# Patient Record
Sex: Female | Born: 1955 | Race: Black or African American | Hispanic: No | Smoking: Current every day smoker
Health system: Southern US, Community
[De-identification: ages and names within clinical notes are randomized; demographics above are authoritative.]

## PROBLEM LIST (undated history)

## (undated) DIAGNOSIS — G8929 Other chronic pain: Secondary | ICD-10-CM

## (undated) DIAGNOSIS — I10 Essential (primary) hypertension: Secondary | ICD-10-CM

## (undated) DIAGNOSIS — F32A Depression, unspecified: Secondary | ICD-10-CM

## (undated) DIAGNOSIS — E7800 Pure hypercholesterolemia, unspecified: Secondary | ICD-10-CM

## (undated) DIAGNOSIS — F10239 Alcohol dependence with withdrawal, unspecified: Secondary | ICD-10-CM

## (undated) DIAGNOSIS — E213 Hyperparathyroidism, unspecified: Secondary | ICD-10-CM

## (undated) DIAGNOSIS — J449 Chronic obstructive pulmonary disease, unspecified: Secondary | ICD-10-CM

## (undated) DIAGNOSIS — K859 Acute pancreatitis without necrosis or infection, unspecified: Secondary | ICD-10-CM

## (undated) DIAGNOSIS — K219 Gastro-esophageal reflux disease without esophagitis: Secondary | ICD-10-CM

## (undated) DIAGNOSIS — F1020 Alcohol dependence, uncomplicated: Secondary | ICD-10-CM

## (undated) DIAGNOSIS — Z8711 Personal history of peptic ulcer disease: Secondary | ICD-10-CM

## (undated) DIAGNOSIS — G43909 Migraine, unspecified, not intractable, without status migrainosus: Secondary | ICD-10-CM

## (undated) DIAGNOSIS — M1990 Unspecified osteoarthritis, unspecified site: Secondary | ICD-10-CM

## (undated) DIAGNOSIS — F329 Major depressive disorder, single episode, unspecified: Secondary | ICD-10-CM

## (undated) DIAGNOSIS — M545 Low back pain, unspecified: Secondary | ICD-10-CM

## (undated) DIAGNOSIS — J189 Pneumonia, unspecified organism: Secondary | ICD-10-CM

## (undated) DIAGNOSIS — F10939 Alcohol use, unspecified with withdrawal, unspecified: Secondary | ICD-10-CM

## (undated) DIAGNOSIS — K259 Gastric ulcer, unspecified as acute or chronic, without hemorrhage or perforation: Secondary | ICD-10-CM

## (undated) DIAGNOSIS — J45909 Unspecified asthma, uncomplicated: Secondary | ICD-10-CM

## (undated) DIAGNOSIS — F419 Anxiety disorder, unspecified: Secondary | ICD-10-CM

## (undated) DIAGNOSIS — K5790 Diverticulosis of intestine, part unspecified, without perforation or abscess without bleeding: Secondary | ICD-10-CM

## (undated) DIAGNOSIS — D519 Vitamin B12 deficiency anemia, unspecified: Secondary | ICD-10-CM

## (undated) DIAGNOSIS — R011 Cardiac murmur, unspecified: Secondary | ICD-10-CM

## (undated) DIAGNOSIS — K635 Polyp of colon: Secondary | ICD-10-CM

## (undated) DIAGNOSIS — Z8719 Personal history of other diseases of the digestive system: Secondary | ICD-10-CM

## (undated) HISTORY — PX: THUMB FUSION: SUR636

## (undated) HISTORY — PX: FRACTURE SURGERY: SHX138

## (undated) HISTORY — DX: Alcohol dependence, uncomplicated: F10.20

## (undated) HISTORY — PX: TUBAL LIGATION: SHX77

## (undated) HISTORY — DX: Hyperparathyroidism, unspecified: E21.3

## (undated) HISTORY — DX: Diverticulosis of intestine, part unspecified, without perforation or abscess without bleeding: K57.90

## (undated) HISTORY — DX: Acute pancreatitis without necrosis or infection, unspecified: K85.90

## (undated) HISTORY — DX: Unspecified asthma, uncomplicated: J45.909

## (undated) HISTORY — DX: Polyp of colon: K63.5

## (undated) HISTORY — PX: ABDOMINAL HYSTERECTOMY: SHX81

## (undated) HISTORY — PX: TIBIA FRACTURE SURGERY: SHX806

## (undated) HISTORY — DX: Gastric ulcer, unspecified as acute or chronic, without hemorrhage or perforation: K25.9

## (undated) HISTORY — PX: EXCISIONAL HEMORRHOIDECTOMY: SHX1541

---

## 2013-03-31 LAB — HM DEXA SCAN

## 2014-06-14 LAB — HM MAMMOGRAPHY: HM Mammogram: NORMAL

## 2015-03-04 LAB — HM COLONOSCOPY

## 2015-05-14 ENCOUNTER — Encounter (HOSPITAL_BASED_OUTPATIENT_CLINIC_OR_DEPARTMENT_OTHER): Payer: Self-pay

## 2015-05-14 ENCOUNTER — Emergency Department (HOSPITAL_BASED_OUTPATIENT_CLINIC_OR_DEPARTMENT_OTHER): Payer: Medicaid - Out of State

## 2015-05-14 ENCOUNTER — Inpatient Hospital Stay (HOSPITAL_BASED_OUTPATIENT_CLINIC_OR_DEPARTMENT_OTHER)
Admission: EM | Admit: 2015-05-14 | Discharge: 2015-05-18 | DRG: 392 | Disposition: A | Discharge status: Home / Self Care | Payer: Medicaid - Out of State | Attending: Internal Medicine | Admitting: Internal Medicine

## 2015-05-14 DIAGNOSIS — R001 Bradycardia, unspecified: Secondary | ICD-10-CM | POA: Diagnosis present

## 2015-05-14 DIAGNOSIS — F17210 Nicotine dependence, cigarettes, uncomplicated: Secondary | ICD-10-CM | POA: Diagnosis present

## 2015-05-14 DIAGNOSIS — F419 Anxiety disorder, unspecified: Secondary | ICD-10-CM | POA: Diagnosis present

## 2015-05-14 DIAGNOSIS — Z880 Allergy status to penicillin: Secondary | ICD-10-CM | POA: Diagnosis not present

## 2015-05-14 DIAGNOSIS — I10 Essential (primary) hypertension: Secondary | ICD-10-CM | POA: Diagnosis present

## 2015-05-14 DIAGNOSIS — E785 Hyperlipidemia, unspecified: Secondary | ICD-10-CM | POA: Diagnosis present

## 2015-05-14 DIAGNOSIS — M1990 Unspecified osteoarthritis, unspecified site: Secondary | ICD-10-CM | POA: Diagnosis present

## 2015-05-14 DIAGNOSIS — K5732 Diverticulitis of large intestine without perforation or abscess without bleeding: Principal | ICD-10-CM | POA: Diagnosis present

## 2015-05-14 DIAGNOSIS — F329 Major depressive disorder, single episode, unspecified: Secondary | ICD-10-CM | POA: Diagnosis present

## 2015-05-14 DIAGNOSIS — K219 Gastro-esophageal reflux disease without esophagitis: Secondary | ICD-10-CM | POA: Diagnosis present

## 2015-05-14 DIAGNOSIS — K5792 Diverticulitis of intestine, part unspecified, without perforation or abscess without bleeding: Secondary | ICD-10-CM | POA: Diagnosis not present

## 2015-05-14 DIAGNOSIS — G8929 Other chronic pain: Secondary | ICD-10-CM | POA: Diagnosis present

## 2015-05-14 DIAGNOSIS — Z8711 Personal history of peptic ulcer disease: Secondary | ICD-10-CM | POA: Diagnosis not present

## 2015-05-14 DIAGNOSIS — Z8701 Personal history of pneumonia (recurrent): Secondary | ICD-10-CM

## 2015-05-14 DIAGNOSIS — Z91040 Latex allergy status: Secondary | ICD-10-CM

## 2015-05-14 DIAGNOSIS — J449 Chronic obstructive pulmonary disease, unspecified: Secondary | ICD-10-CM | POA: Diagnosis present

## 2015-05-14 DIAGNOSIS — R1032 Left lower quadrant pain: Secondary | ICD-10-CM | POA: Diagnosis not present

## 2015-05-14 DIAGNOSIS — I252 Old myocardial infarction: Secondary | ICD-10-CM | POA: Diagnosis not present

## 2015-05-14 DIAGNOSIS — Z90710 Acquired absence of both cervix and uterus: Secondary | ICD-10-CM | POA: Diagnosis not present

## 2015-05-14 DIAGNOSIS — M545 Low back pain: Secondary | ICD-10-CM | POA: Diagnosis present

## 2015-05-14 DIAGNOSIS — G43909 Migraine, unspecified, not intractable, without status migrainosus: Secondary | ICD-10-CM | POA: Diagnosis present

## 2015-05-14 DIAGNOSIS — E876 Hypokalemia: Secondary | ICD-10-CM | POA: Diagnosis present

## 2015-05-14 HISTORY — DX: Pure hypercholesterolemia, unspecified: E78.00

## 2015-05-14 HISTORY — DX: Vitamin B12 deficiency anemia, unspecified: D51.9

## 2015-05-14 HISTORY — DX: Low back pain: M54.5

## 2015-05-14 HISTORY — DX: Other chronic pain: G89.29

## 2015-05-14 HISTORY — DX: Low back pain, unspecified: M54.50

## 2015-05-14 HISTORY — DX: Migraine, unspecified, not intractable, without status migrainosus: G43.909

## 2015-05-14 HISTORY — DX: Major depressive disorder, single episode, unspecified: F32.9

## 2015-05-14 HISTORY — DX: Depression, unspecified: F32.A

## 2015-05-14 HISTORY — DX: Personal history of peptic ulcer disease: Z87.11

## 2015-05-14 HISTORY — DX: Gastro-esophageal reflux disease without esophagitis: K21.9

## 2015-05-14 HISTORY — DX: Unspecified osteoarthritis, unspecified site: M19.90

## 2015-05-14 HISTORY — DX: Anxiety disorder, unspecified: F41.9

## 2015-05-14 HISTORY — DX: Pneumonia, unspecified organism: J18.9

## 2015-05-14 HISTORY — DX: Essential (primary) hypertension: I10

## 2015-05-14 HISTORY — DX: Alcohol use, unspecified with withdrawal, unspecified: F10.939

## 2015-05-14 HISTORY — DX: Cardiac murmur, unspecified: R01.1

## 2015-05-14 HISTORY — DX: Chronic obstructive pulmonary disease, unspecified: J44.9

## 2015-05-14 HISTORY — DX: Personal history of other diseases of the digestive system: Z87.19

## 2015-05-14 HISTORY — DX: Alcohol dependence with withdrawal, unspecified: F10.239

## 2015-05-14 LAB — URINALYSIS, ROUTINE W REFLEX MICROSCOPIC
BILIRUBIN URINE: NEGATIVE
GLUCOSE, UA: NEGATIVE mg/dL
HGB URINE DIPSTICK: NEGATIVE
Ketones, ur: NEGATIVE mg/dL
LEUKOCYTES UA: NEGATIVE
Nitrite: POSITIVE — AB
Protein, ur: NEGATIVE mg/dL
Specific Gravity, Urine: 1.013 (ref 1.005–1.030)
Urobilinogen, UA: 1 mg/dL (ref 0.0–1.0)
pH: 7.5 (ref 5.0–8.0)

## 2015-05-14 LAB — URINE MICROSCOPIC-ADD ON

## 2015-05-14 LAB — COMPREHENSIVE METABOLIC PANEL
ALBUMIN: 3.5 g/dL (ref 3.5–5.0)
ALT: 11 U/L — AB (ref 14–54)
AST: 13 U/L — AB (ref 15–41)
Alkaline Phosphatase: 68 U/L (ref 38–126)
Anion gap: 4 — ABNORMAL LOW (ref 5–15)
BUN: 7 mg/dL (ref 6–20)
CHLORIDE: 114 mmol/L — AB (ref 101–111)
CO2: 23 mmol/L (ref 22–32)
Calcium: 9.1 mg/dL (ref 8.9–10.3)
Creatinine, Ser: 0.75 mg/dL (ref 0.44–1.00)
GFR calc Af Amer: >60 mL/min (ref >60)
Glucose, Bld: 96 mg/dL (ref 65–99)
POTASSIUM: 3.2 mmol/L — AB (ref 3.5–5.1)
Sodium: 141 mmol/L (ref 135–145)
TOTAL PROTEIN: 6.7 g/dL (ref 6.5–8.1)
Total Bilirubin: 0.5 mg/dL (ref 0.3–1.2)

## 2015-05-14 LAB — CBC WITH DIFFERENTIAL/PLATELET
BASOS ABS: 0 10*3/uL (ref 0.0–0.1)
Basophils Relative: 0 % (ref 0–1)
EOS ABS: 0.1 10*3/uL (ref 0.0–0.7)
Eosinophils Relative: 1 % (ref 0–5)
HCT: 40.3 % (ref 36.0–46.0)
Hemoglobin: 13.2 g/dL (ref 12.0–15.0)
Lymphocytes Relative: 18 % (ref 12–46)
Lymphs Abs: 1.5 10*3/uL (ref 0.7–4.0)
MCH: 30 pg (ref 26.0–34.0)
MCHC: 32.8 g/dL (ref 30.0–36.0)
MCV: 91.6 fL (ref 78.0–100.0)
MONOS PCT: 7 % (ref 3–12)
Monocytes Absolute: 0.6 10*3/uL (ref 0.1–1.0)
Neutro Abs: 6.3 10*3/uL (ref 1.7–7.7)
Neutrophils Relative %: 74 % (ref 43–77)
Platelets: 286 10*3/uL (ref 150–400)
RBC: 4.4 MIL/uL (ref 3.87–5.11)
RDW: 13.1 % (ref 11.5–15.5)
WBC: 8.6 10*3/uL (ref 4.0–10.5)

## 2015-05-14 MED ORDER — ENOXAPARIN SODIUM 40 MG/0.4ML ~~LOC~~ SOLN: 40.0000 mg | SUBCUTANEOUS | Status: DC

## 2015-05-14 MED ORDER — MORPHINE SULFATE 4 MG/ML IJ SOLN
4.0000 mg | INTRAMUSCULAR | Status: AC | PRN
  Administered 2015-05-14 – 2015-05-15 (×3): 4 mg via INTRAVENOUS
  Filled 2015-05-14 (×3): qty 1

## 2015-05-14 MED ORDER — HYDROMORPHONE HCL 1 MG/ML IJ SOLN
1.0000 mg | Freq: Once | INTRAMUSCULAR | Status: AC
  Administered 2015-05-14: 1 mg via INTRAVENOUS
  Filled 2015-05-14: qty 1

## 2015-05-14 MED ORDER — METRONIDAZOLE IN NACL 5-0.79 MG/ML-% IV SOLN
500.0000 mg | Freq: Once | INTRAVENOUS | Status: AC
  Administered 2015-05-14: 500 mg via INTRAVENOUS
  Filled 2015-05-14: qty 100

## 2015-05-14 MED ORDER — CIPROFLOXACIN IN D5W 400 MG/200ML IV SOLN
400.0000 mg | Freq: Two times a day (BID) | INTRAVENOUS | Status: DC
  Administered 2015-05-15 – 2015-05-17 (×5): 400 mg via INTRAVENOUS
  Filled 2015-05-14 (×6): qty 200

## 2015-05-14 MED ORDER — ONDANSETRON HCL 4 MG/2ML IJ SOLN
4.0000 mg | Freq: Four times a day (QID) | INTRAMUSCULAR | Status: DC | PRN
  Administered 2015-05-15 (×2): 4 mg via INTRAVENOUS
  Filled 2015-05-14 (×2): qty 2

## 2015-05-14 MED ORDER — MORPHINE SULFATE 2 MG/ML IJ SOLN
2.0000 mg | INTRAMUSCULAR | Status: DC | PRN
  Administered 2015-05-15: 2 mg via INTRAVENOUS
  Filled 2015-05-14: qty 1

## 2015-05-14 MED ORDER — ONDANSETRON HCL 4 MG/2ML IJ SOLN
4.0000 mg | Freq: Once | INTRAMUSCULAR | Status: AC
  Administered 2015-05-14: 4 mg via INTRAVENOUS
  Filled 2015-05-14: qty 2

## 2015-05-14 MED ORDER — ONDANSETRON HCL 4 MG PO TABS: 4.0000 mg | ORAL_TABLET | Freq: Four times a day (QID) | ORAL | Status: DC | PRN

## 2015-05-14 MED ORDER — POTASSIUM CHLORIDE IN NACL 20-0.9 MEQ/L-% IV SOLN
INTRAVENOUS | Status: DC
  Administered 2015-05-15 – 2015-05-17 (×3): via INTRAVENOUS
  Filled 2015-05-14 (×9): qty 1000

## 2015-05-14 MED ORDER — IOHEXOL 300 MG/ML  SOLN
25.0000 mL | Freq: Once | INTRAMUSCULAR | Status: AC | PRN
  Administered 2015-05-14: 25 mL via ORAL

## 2015-05-14 MED ORDER — IOHEXOL 300 MG/ML  SOLN
100.0000 mL | Freq: Once | INTRAMUSCULAR | Status: AC | PRN
  Administered 2015-05-14: 100 mL via INTRAVENOUS

## 2015-05-14 MED ORDER — CIPROFLOXACIN IN D5W 400 MG/200ML IV SOLN
400.0000 mg | Freq: Once | INTRAVENOUS | Status: AC
  Administered 2015-05-14: 400 mg via INTRAVENOUS
  Filled 2015-05-14: qty 200

## 2015-05-14 MED ORDER — ASPIRIN EC 81 MG PO TBEC
81.0000 mg | DELAYED_RELEASE_TABLET | Freq: Every day | ORAL | Status: DC
  Administered 2015-05-15 – 2015-05-18 (×4): 81 mg via ORAL
  Filled 2015-05-14 (×5): qty 1

## 2015-05-14 MED ORDER — ENOXAPARIN SODIUM 40 MG/0.4ML ~~LOC~~ SOLN
40.0000 mg | Freq: Every day | SUBCUTANEOUS | Status: DC
  Administered 2015-05-15 – 2015-05-17 (×4): 40 mg via SUBCUTANEOUS
  Filled 2015-05-14 (×4): qty 0.4

## 2015-05-14 MED ORDER — METRONIDAZOLE IN NACL 5-0.79 MG/ML-% IV SOLN
500.0000 mg | Freq: Three times a day (TID) | INTRAVENOUS | Status: DC
  Administered 2015-05-15 – 2015-05-17 (×8): 500 mg via INTRAVENOUS
  Filled 2015-05-14 (×9): qty 100

## 2015-05-14 NOTE — H&P (Signed)
[facility] History and Physical  Lisa Pham ZOX:[id number] DOB: 02/23/1956 DOA: 05/14/2015  Referring physician:  PCP: No PCP Per Patient   Chief Complaint: Abdominal pain.  HPI: Lisa Pham is a 59 y.o. female with a past medical history of alcoholism, hypertension, hyperlipidemia, anxiety, depression who is being admitted to the hospital due to left lower quadrant pain, multiple episodes of diarrhea, nausea with so vomiting since earlier in the day. She states this is the first time that she has pain like that. She denies sick contacts or travel history. Workup is consistent with acute diverticulitis of the sigmoid colon.  When seen the patient was in no acute distress and stated that her pain was better.   Review of Systems:  Constitutional:  No weight loss, night sweats, Fevers, chills, fatigue.  HEENT:  No headaches, Difficulty swallowing,Tooth/dental problems,Sore throat,  No sneezing, itching, ear ache, nasal congestion, post nasal drip,  Cardio-vascular:  No chest pain, Orthopnea, PND, swelling in lower extremities, anasarca, dizziness, palpitations  GI:  Positive indigestion, abdominal pain, nausea,  diarrhea,  loss of appetite.  Denies vomiting. Resp:  No shortness of breath with exertion or at rest. No excess mucus, no productive cough, No non-productive cough, No coughing up of blood.No change in color of mucus.No wheezing.No chest wall deformity  Skin:  no rash or lesions.  GU:  no dysuria, change in color of urine, no urgency or frequency. No flank pain.  Musculoskeletal:  No joint pain or swelling. No decreased range of motion. No back pain.  Psych:  No change in mood or affect. No depression or anxiety. No memory loss.   Past Medical History  Diagnosis Date  . Hypertension   . COPD (chronic obstructive pulmonary disease)   . High cholesterol   . Heart murmur   . Myocardial infarction ~ 2011    "mild"  . Pneumonia ~ 2013  . B12 deficiency  anemia   . GERD (gastroesophageal reflux disease)   . History of stomach ulcers   . Migraine     "3-4 times/wk or more" (05/14/2015)  . Chronic lower back pain   . Arthritis     "all over" (05/14/2015)  . Anxiety   . Depression   . Alcohol withdrawal    Past Surgical History  Procedure Laterality Date  . Tibia fracture surgery Left ~ 2000  . Abdominal hysterectomy    . Excisional hemorrhoidectomy  ~ 1970  . Thumb fusion Bilateral ~ 2010    "took something from wrist/forearm & put it in my thumb"  . Tubal ligation    . Fracture surgery     Social History:  reports that she has been smoking Cigarettes.  She has a 5.4 pack-year smoking history. She has never used smokeless tobacco. She reports that she drinks about 7.2 oz of alcohol per week. She reports that she uses illicit drugs (Cocaine, "Crack" cocaine, and Marijuana).  Allergies  Allergen Reactions  . Latex   . Penicillins Other (See Comments)    Yeast infection    History reviewed. No pertinent family history.   Prior to Admission medications   Medication Sig Start Date End Date Taking? Authorizing Provider  amLODipine (NORVASC) 10 MG tablet Take 10 mg by mouth daily.   Yes Historical Provider, MD  atorvastatin (LIPITOR) 40 MG tablet Take 40 mg by mouth daily.   Yes Historical Provider, MD  gabapentin (NEURONTIN) 600 MG tablet Take 600 mg by mouth 3 (three) times daily.   Yes  Historical Provider, MD  losartan (COZAAR) 100 MG tablet Take 100 mg by mouth daily.   Yes Historical Provider, MD  montelukast (SINGULAIR) 10 MG tablet Take 10 mg by mouth at bedtime.   Yes Historical Provider, MD  topiramate (TOPAMAX) 50 MG tablet Take 50 mg by mouth 2 (two) times daily.   Yes Historical Provider, MD  venlafaxine (EFFEXOR) 25 MG tablet Take 25 mg by mouth 2 (two) times daily.   Yes Historical Provider, MD  vitamin B-12 (CYANOCOBALAMIN) 1000 MCG tablet Take 1,000 mcg by mouth daily.   Yes Historical Provider, MD  Vitamin D,  Ergocalciferol, (DRISDOL) 50000 UNITS CAPS capsule Take 50,000 Units by mouth every 7 (seven) days.   Yes Historical Provider, MD   Physical Exam: Filed Vitals:   05/14/15 1740 05/14/15 1947 05/14/15 2111 05/14/15 2257  BP: 108/62 134/72 101/60 114/50  Pulse: 55 56 51 51  Temp:  98.1 F (36.7 C)  98.7 F (37.1 C)  TempSrc:  Oral  Oral  Resp: 18 18 16 18   Height:    5\' 4"  (1.626 m)  Weight:    68.6 kg (151 lb 3.8 oz)  SpO2: 95% 98% 93% 98%    Wt Readings from Last 3 Encounters:  05/14/15 68.6 kg (151 lb 3.8 oz)    General:  Appears calm and comfortable Eyes: PERRL, normal lids, irises & conjunctiva ENT: grossly normal hearing, lips & tongue Neck: no LAD, masses or thyromegaly Cardiovascular: RRR, no m/r/g. No LE edema. Telemetry: SR, no arrhythmias  Respiratory: CTA bilaterally, no w/r/r. Normal respiratory effort. Abdomen: Bowel sounds hyperactive, soft, left lower quadrant tenderness on palpation without guarding or rebound tenderness. Skin: no rash or induration seen on limited exam Musculoskeletal: grossly normal tone BUE/BLE Psychiatric: grossly normal mood and affect, speech fluent and appropriate Neurologic: grossly non-focal.          Labs on Admission:  Basic Metabolic Panel:  Recent Labs Lab 05/14/15 1531  NA 141  K 3.2  CL 114  CO2 23  GLUCOSE 96  BUN 7  CREATININE 0.75  CALCIUM 9.1   Liver Function Tests:  Recent Labs Lab 05/14/15 1531  AST 13  ALT 11  ALKPHOS 68  BILITOT 0.5  PROT 6.7  ALBUMIN 3.5   No results for input(s): LIPASE, AMYLASE in the last 168 hours. No results for input(s): AMMONIA in the last 168 hours. CBC:  Recent Labs Lab 05/14/15 1531  WBC 8.6  NEUTROABS 6.3  HGB 13.2  HCT 40.3  MCV 91.6  PLT 286   Cardiac Enzymes: No results for input(s): CKTOTAL, CKMB, CKMBINDEX, TROPONINI in the last 168 hours.  BNP (last 3 results) No results for input(s): BNP in the last 8760 hours.  ProBNP (last 3 results) No  results for input(s): PROBNP in the last 8760 hours.  CBG: No results for input(s): GLUCAP in the last 168 hours.  Radiological Exams on Admission: Ct Abdomen Pelvis W Contrast  05/14/2015   CLINICAL DATA:  Left lower quadrant pain, nausea, diarrhea  EXAM: CT ABDOMEN AND PELVIS WITH CONTRAST  TECHNIQUE: Multidetector CT imaging of the abdomen and pelvis was performed using the standard protocol following bolus administration of intravenous contrast.  CONTRAST:  OMNIPAQUE IOHEXOL 300 MG/ML  SOLN  COMPARISON:  None.  FINDINGS: Lower chest:  Lung bases are clear.  Hepatobiliary: Liver is within normal limits. No suspicious/enhancing hepatic lesions.  Layering gallstone (series 2/ image 33), without associated inflammatory changes.  Pancreas: Within normal limits.  Spleen: Within normal limits.  Adrenals/Urinary Tract: Adrenal glands are within normal limits.  6 mm cyst in the anterior interpolar right kidney (series 7/ image 17). Left kidney is within normal limits. No hydronephrosis.  Bladder is within normal limits.  Stomach/Bowel: Stomach is within normal limits.  No evidence of bowel obstruction.  Normal appendix.  Colonic diverticulosis with inflammatory changes in the left lower quadrant (series 2/ image 62), suggesting proximal sigmoid diverticulitis.  Trace pericolonic fluid/stranding. No drainable fluid collection/ abscess. No free air.  Vascular/Lymphatic: Atherosclerotic calcifications of the abdominal aorta and branch vessels.  No suspicious abdominopelvic lymphadenopathy.  Reproductive: Status post hysterectomy.  No adnexal masses.  Other: Trace pelvic ascites.  Musculoskeletal: Degenerative changes of the visualized thoracolumbar spine, most prominent at L5-S1.  IMPRESSION: Sigmoid diverticulitis.  No evidence of perforation.  Trace pelvic ascites.   Electronically Signed   By: Charline Bills M.D.   On: 05/14/2015 18:10       Assessment/Plan Active Problems:   Diverticulitis    Acute diverticulitis   Hypokalemia.   Hypertension.   COPD.   1. Admitted for IV antibiotic therapy with ciprofloxacin and metronidazole. Continue IV fluids, continue potassium replacement and will give magnesium sulfate since the patient drinks regularly. The patient was advised to be nothing by mouth at least overnight to avoid abdominal discomfort. Continue correcting electrolytes. Continue home pertinent metastases.  Although the patient stated that she is not drinking heavily, has not had alcohol since Saturday, please monitor for signs of DTs.   Code Status: Full code. DVT Prophylaxis: Lovenox. Family Communication:  Ndia, Sampath Daughter   [phone]  Disposition Plan: Home with outpatient follow-up.  Time spent: 70 minutes  Bobette Mo [facility] Pager [phone]

## 2015-05-14 NOTE — ED Notes (Signed)
Patient transported to CT 

## 2015-05-14 NOTE — ED Notes (Signed)
C/o LLQ pain and diarrhea-c/o started Saturday

## 2015-05-14 NOTE — ED Notes (Signed)
Pt stated unable to void-given CCUA

## 2015-05-14 NOTE — ED Provider Notes (Signed)
CSN: [id number]     Arrival date & time 05/14/15  1408 History   First MD Initiated Contact with Patient 05/14/15 1510     Chief Complaint  Patient presents with  . Abdominal Pain      HPI  Patient presents for evaluation of abdominal pain after diarrhea. Today is Tuesday. Symptoms started on Saturday with diarrhea. By Sunday her diarrhea had resolved. However she has had persistent left lower quadrant pain since that time. Now has pain with ambulation and movement. States she is not having liquidy stools. However she is passing "pus filled stool". Denies blood.  She's had a colonoscopy a little over a month ago. Was told she had 2 polyps that were removed and "not cancer" cannot recall if she was told about diverticuli. No travel. No recent antibiotics.  Past Medical History  Diagnosis Date  . Hypertension   . COPD (chronic obstructive pulmonary disease)   . High cholesterol   . Heart murmur   . Myocardial infarction ~ 2011    "mild"  . Pneumonia ~ 2013  . B12 deficiency anemia   . GERD (gastroesophageal reflux disease)   . History of stomach ulcers   . Migraine     "3-4 times/wk or more" (05/14/2015)  . Chronic lower back pain   . Arthritis     "all over" (05/14/2015)  . Anxiety   . Depression   . Alcohol withdrawal    Past Surgical History  Procedure Laterality Date  . Tibia fracture surgery Left ~ 2000  . Abdominal hysterectomy    . Excisional hemorrhoidectomy  ~ 1970  . Thumb fusion Bilateral ~ 2010    "took something from wrist/forearm & put it in my thumb"  . Tubal ligation    . Fracture surgery     History reviewed. No pertinent family history. History  Substance Use Topics  . Smoking status: Current Every Day Smoker -- 0.12 packs/day for 45 years    Types: Cigarettes  . Smokeless tobacco: Never Used     Comment: "allergic to nicotine patch"  . Alcohol Use: 7.2 oz/week    12 Cans of beer per week     Comment: 05/14/2015 "II'm an alcoholic; I may drink 1, 12  pack/wk; sometimes more when I want it; might drink a 6 pack of wine coolers but I don't drink beer w/that; ; get shaky after a couple days if I don't drink"   OB History    No data available     Review of Systems  Constitutional: Negative for fever, chills, diaphoresis, appetite change and fatigue.  HENT: Negative for mouth sores, sore throat and trouble swallowing.   Eyes: Negative for visual disturbance.  Respiratory: Negative for cough, chest tightness, shortness of breath and wheezing.   Cardiovascular: Negative for chest pain.  Gastrointestinal: Positive for nausea, abdominal pain and diarrhea. Negative for vomiting and abdominal distention.  Endocrine: Negative for polydipsia, polyphagia and polyuria.  Genitourinary: Negative for dysuria, frequency and hematuria.  Musculoskeletal: Negative for gait problem.  Skin: Negative for color change, pallor and rash.  Neurological: Negative for dizziness, syncope, light-headedness and headaches.  Hematological: Does not bruise/bleed easily.  Psychiatric/Behavioral: Negative for behavioral problems and confusion.      Allergies  Latex; Penicillins; and Nicotine  Home Medications   Prior to Admission medications   Medication Sig Start Date End Date Taking? Authorizing Provider  amLODipine (NORVASC) 10 MG tablet Take 10 mg by mouth daily.   Yes Historical Provider, MD  atorvastatin (LIPITOR) 40 MG tablet Take 40 mg by mouth daily.   Yes Historical Provider, MD  gabapentin (NEURONTIN) 600 MG tablet Take 600 mg by mouth 3 (three) times daily.   Yes Historical Provider, MD  losartan (COZAAR) 100 MG tablet Take 100 mg by mouth daily.   Yes Historical Provider, MD  montelukast (SINGULAIR) 10 MG tablet Take 10 mg by mouth at bedtime.   Yes Historical Provider, MD  topiramate (TOPAMAX) 50 MG tablet Take 50 mg by mouth 2 (two) times daily.   Yes Historical Provider, MD  venlafaxine (EFFEXOR) 25 MG tablet Take 25 mg by mouth 2 (two) times  daily.   Yes Historical Provider, MD  vitamin B-12 (CYANOCOBALAMIN) 1000 MCG tablet Take 1,000 mcg by mouth daily.   Yes Historical Provider, MD  Vitamin D, Ergocalciferol, (DRISDOL) 50000 UNITS CAPS capsule Take 50,000 Units by mouth every 7 (seven) days.   Yes Historical Provider, MD   BP 114/50 mmHg  Pulse 51  Temp(Src) 98.7 F (37.1 C) (Oral)  Resp 18  Ht 5\' 4"  (1.626 m)  Wt 151 lb 3.8 oz (68.6 kg)  BMI 25.95 kg/m2  SpO2 98% Physical Exam  Constitutional: She is oriented to person, place, and time. She appears well-developed and well-nourished. No distress.  HENT:  Head: Normocephalic.  Eyes: Conjunctivae are normal. Pupils are equal, round, and reactive to light. No scleral icterus.  Neck: Normal range of motion. Neck supple. No thyromegaly present.  Cardiovascular: Normal rate and regular rhythm.  Exam reveals no gallop and no friction rub.   No murmur heard. Pulmonary/Chest: Effort normal and breath sounds normal. No respiratory distress. She has no wheezes. She has no rales.  Abdominal: Soft. Bowel sounds are normal. She exhibits no distension. There is tenderness. There is no rebound.    Musculoskeletal: Normal range of motion.  Neurological: She is alert and oriented to person, place, and time.  Skin: Skin is warm and dry. No rash noted.  Psychiatric: She has a normal mood and affect. Her behavior is normal.    ED Course  Procedures (including critical care time) Labs Review Labs Reviewed  URINALYSIS, ROUTINE W REFLEX MICROSCOPIC (NOT AT [facility]) - Abnormal; Notable for the following:    APPearance CLOUDY (*)    Nitrite POSITIVE (*)    All other components within normal limits  URINE MICROSCOPIC-ADD ON - Abnormal; Notable for the following:    Squamous Epithelial / LPF FEW (*)    Bacteria, UA MANY (*)    All other components within normal limits  COMPREHENSIVE METABOLIC PANEL - Abnormal; Notable for the following:    Potassium 3.2 (*)    Chloride 114 (*)    AST 13  (*)    ALT 11 (*)    Anion gap 4 (*)    All other components within normal limits  CBC WITH DIFFERENTIAL/PLATELET  CBC  CREATININE, SERUM  CBC WITH DIFFERENTIAL/PLATELET  COMPREHENSIVE METABOLIC PANEL    Imaging Review Ct Abdomen Pelvis W Contrast  05/14/2015   CLINICAL DATA:  Left lower quadrant pain, nausea, diarrhea  EXAM: CT ABDOMEN AND PELVIS WITH CONTRAST  TECHNIQUE: Multidetector CT imaging of the abdomen and pelvis was performed using the standard protocol following bolus administration of intravenous contrast.  CONTRAST:  OMNIPAQUE IOHEXOL 300 MG/ML  SOLN  COMPARISON:  None.  FINDINGS: Lower chest:  Lung bases are clear.  Hepatobiliary: Liver is within normal limits. No suspicious/enhancing hepatic lesions.  Layering gallstone (series 2/ image 33), without  associated inflammatory changes.  Pancreas: Within normal limits.  Spleen: Within normal limits.  Adrenals/Urinary Tract: Adrenal glands are within normal limits.  6 mm cyst in the anterior interpolar right kidney (series 7/ image 17). Left kidney is within normal limits. No hydronephrosis.  Bladder is within normal limits.  Stomach/Bowel: Stomach is within normal limits.  No evidence of bowel obstruction.  Normal appendix.  Colonic diverticulosis with inflammatory changes in the left lower quadrant (series 2/ image 62), suggesting proximal sigmoid diverticulitis.  Trace pericolonic fluid/stranding. No drainable fluid collection/ abscess. No free air.  Vascular/Lymphatic: Atherosclerotic calcifications of the abdominal aorta and branch vessels.  No suspicious abdominopelvic lymphadenopathy.  Reproductive: Status post hysterectomy.  No adnexal masses.  Other: Trace pelvic ascites.  Musculoskeletal: Degenerative changes of the visualized thoracolumbar spine, most prominent at L5-S1.  IMPRESSION: Sigmoid diverticulitis.  No evidence of perforation.  Trace pelvic ascites.   Electronically Signed   By: Charline Bills M.D.   On: 05/14/2015  18:10     EKG Interpretation None      MDM   Final diagnoses:  LLQ pain  Diverticulitis of large intestine without perforation or abscess without bleeding    Patient given IV antibiotics per she is still quite painful. CT shows diverticulitis but no frank perforation or abscess. She still is painful walks with a markedly antalgic gait. I discussed the case with Dr. Everardo Pacific. Patient be transferred to [facility] for admission and further care.    Rolland Porter, MD 05/15/15 Marlyne Beards

## 2015-05-14 NOTE — ED Notes (Signed)
Report given to PT[facility] , carelink unavailable

## 2015-05-15 DIAGNOSIS — J449 Chronic obstructive pulmonary disease, unspecified: Secondary | ICD-10-CM

## 2015-05-15 DIAGNOSIS — E876 Hypokalemia: Secondary | ICD-10-CM

## 2015-05-15 LAB — COMPREHENSIVE METABOLIC PANEL
ALK PHOS: 57 U/L (ref 38–126)
ALT: 8 U/L — ABNORMAL LOW (ref 14–54)
AST: 12 U/L — AB (ref 15–41)
Albumin: 3.1 g/dL — ABNORMAL LOW (ref 3.5–5.0)
Anion gap: 6 (ref 5–15)
BUN: 6 mg/dL (ref 6–20)
CO2: 24 mmol/L (ref 22–32)
Calcium: 8.9 mg/dL (ref 8.9–10.3)
Chloride: 112 mmol/L — ABNORMAL HIGH (ref 101–111)
Creatinine, Ser: 0.82 mg/dL (ref 0.44–1.00)
GFR calc Af Amer: >60 mL/min (ref >60)
GFR calc non Af Amer: >60 mL/min (ref >60)
Glucose, Bld: 92 mg/dL (ref 65–99)
POTASSIUM: 3.4 mmol/L — AB (ref 3.5–5.1)
Sodium: 142 mmol/L (ref 135–145)
TOTAL PROTEIN: 6 g/dL — AB (ref 6.5–8.1)
Total Bilirubin: 0.6 mg/dL (ref 0.3–1.2)

## 2015-05-15 LAB — CBC WITH DIFFERENTIAL/PLATELET
BASOS PCT: 0 % (ref 0–1)
Basophils Absolute: 0 10*3/uL (ref 0.0–0.1)
EOS ABS: 0.2 10*3/uL (ref 0.0–0.7)
Eosinophils Relative: 2 % (ref 0–5)
HEMATOCRIT: 36.3 % (ref 36.0–46.0)
Hemoglobin: 11.9 g/dL — ABNORMAL LOW (ref 12.0–15.0)
Lymphocytes Relative: 30 % (ref 12–46)
Lymphs Abs: 2.2 10*3/uL (ref 0.7–4.0)
MCH: 30.4 pg (ref 26.0–34.0)
MCHC: 32.8 g/dL (ref 30.0–36.0)
MCV: 92.8 fL (ref 78.0–100.0)
Monocytes Absolute: 0.5 10*3/uL (ref 0.1–1.0)
Monocytes Relative: 7 % (ref 3–12)
NEUTROS ABS: 4.4 10*3/uL (ref 1.7–7.7)
Neutrophils Relative %: 61 % (ref 43–77)
Platelets: 255 10*3/uL (ref 150–400)
RBC: 3.91 MIL/uL (ref 3.87–5.11)
RDW: 13.5 % (ref 11.5–15.5)
WBC: 7.3 10*3/uL (ref 4.0–10.5)

## 2015-05-15 LAB — CBC
HCT: 38.2 % (ref 36.0–46.0)
HEMOGLOBIN: 12.4 g/dL (ref 12.0–15.0)
MCH: 29.7 pg (ref 26.0–34.0)
MCHC: 32.5 g/dL (ref 30.0–36.0)
MCV: 91.6 fL (ref 78.0–100.0)
PLATELETS: 282 10*3/uL (ref 150–400)
RBC: 4.17 MIL/uL (ref 3.87–5.11)
RDW: 13.4 % (ref 11.5–15.5)
WBC: 7.9 10*3/uL (ref 4.0–10.5)

## 2015-05-15 LAB — CREATININE, SERUM
Creatinine, Ser: 0.84 mg/dL (ref 0.44–1.00)
GFR calc non Af Amer: >60 mL/min (ref >60)

## 2015-05-15 MED ORDER — MORPHINE SULFATE 2 MG/ML IJ SOLN
2.0000 mg | INTRAMUSCULAR | Status: DC | PRN
  Administered 2015-05-15 – 2015-05-18 (×18): 2 mg via INTRAVENOUS
  Filled 2015-05-15 (×18): qty 1

## 2015-05-15 MED ORDER — TIOTROPIUM BROMIDE MONOHYDRATE 18 MCG IN CAPS
18.0000 ug | ORAL_CAPSULE | Freq: Every day | RESPIRATORY_TRACT | Status: DC
  Administered 2015-05-16 – 2015-05-18 (×3): 18 ug via RESPIRATORY_TRACT
  Filled 2015-05-15: qty 5

## 2015-05-15 MED ORDER — ATORVASTATIN CALCIUM 40 MG PO TABS
40.0000 mg | ORAL_TABLET | Freq: Every day | ORAL | Status: DC
  Administered 2015-05-15 – 2015-05-18 (×4): 40 mg via ORAL
  Filled 2015-05-15 (×4): qty 1

## 2015-05-15 MED ORDER — POTASSIUM CHLORIDE CRYS ER 20 MEQ PO TBCR
40.0000 meq | EXTENDED_RELEASE_TABLET | Freq: Once | ORAL | Status: AC
  Administered 2015-05-15: 40 meq via ORAL
  Filled 2015-05-15: qty 2

## 2015-05-15 MED ORDER — GABAPENTIN 600 MG PO TABS
600.0000 mg | ORAL_TABLET | Freq: Three times a day (TID) | ORAL | Status: DC
  Administered 2015-05-15 – 2015-05-18 (×10): 600 mg via ORAL
  Filled 2015-05-15 (×10): qty 1

## 2015-05-15 MED ORDER — TOPIRAMATE 25 MG PO TABS
50.0000 mg | ORAL_TABLET | Freq: Two times a day (BID) | ORAL | Status: DC
Start: 2015-05-15 — End: 2015-05-18
  Administered 2015-05-15 – 2015-05-18 (×7): 50 mg via ORAL
  Filled 2015-05-15 (×7): qty 2

## 2015-05-15 MED ORDER — VENLAFAXINE HCL 25 MG PO TABS
25.0000 mg | ORAL_TABLET | Freq: Two times a day (BID) | ORAL | Status: DC
Start: 2015-05-15 — End: 2015-05-18
  Administered 2015-05-15 – 2015-05-18 (×7): 25 mg via ORAL
  Filled 2015-05-15 (×10): qty 1

## 2015-05-15 NOTE — Progress Notes (Signed)
Initial Nutrition Assessment  DOCUMENTATION CODES:   Not applicable  INTERVENTION:   -RD will follow for diet advancement and supplement diet as appopriate  NUTRITION DIAGNOSIS:   Inadequate oral intake related to altered GI function as evidenced by NPO status.  GOAL:   Patient will meet greater than or equal to 90% of their needs  MONITOR:   Skin, PO intake, Diet advancement, Labs, Weight trends, I & O's  REASON FOR ASSESSMENT:   Malnutrition Screening Tool    ASSESSMENT:   Lisa Pham is a 59 y.o. female with a past medical history of alcoholism, hypertension, hyperlipidemia, anxiety, depression who is being admitted to the hospital due to left lower quadrant pain, multiple episodes of diarrhea, nausea with so vomiting since earlier in the day. She states this is the first time that she has pain like that. She denies sick contacts or travel history. Workup is consistent with acute diverticulitis of the sigmoid colon.  Pt admitted with diverticulitis.   Hx obtained from pt at bedside. She reports ongoing weight loss over the past 4 months, which she attributes to stress. Pt became very tearful at time of visit; she revealed to this RD that she has had a lot going on in the past year including family losses, disability, financial difficulties, and recently relocating to the [place] area to be closer to family. She reports that her appetite has been very poor since Saturday, due to abdominal pain, but has been eating less over the past 4 months due to stress. RD provided emotional support to pt.  Pt reports UBW of 180#. She experienced a 10# intentional weight loss by altering her eating habits (grilled foods and vegetables) in order to prevent diabetes diagnosis. However, she estimates she has lost 19# (11%) within the past 4 months.   She also complains of nausea after eating ice chips.   Nutrition-Focused physical exam completed. Findings are mild depletion fat  depletion (orbital area only), mild muscle depletion (temple area only), and no edema. Pt reports that her family members have mentioned that she has lost weight in her face and that her clothing is looser.   Pt is unsure if she would like to try supplements when diet is advanced, but is willing to consider them if she continues to eat poorly. Discussed importance of good nutritional intake to support healing.  Labs reviewed: K: 3.4.   Diet Order:  Diet NPO time specified Except for: Sips with Meds, Ice Chips  Skin:  Reviewed, no issues  Last BM:  05/11/15  Height:   Ht Readings from Last 1 Encounters:  05/14/15  (1.626 m)    Weight:   Wt Readings from Last 1 Encounters:  05/14/15 151 lb 3.8 oz (68.6 kg)    Ideal Body Weight:  54.4 kg  Wt Readings from Last 10 Encounters:  05/14/15 151 lb 3.8 oz (68.6 kg)    BMI:  Body mass index is 25.95 kg/(m^2).  Estimated Nutritional Needs:   Kcal:  1700-1900  Protein:  75-85 grams  Fluid:  1.7-1.9 L  EDUCATION NEEDS:   Education needs addressed  Stachia Slutsky A. Mayford Knife, RD, LDN, CDE Pager: [phone] After hours Pager: [phone]

## 2015-05-15 NOTE — Progress Notes (Signed)
[facility] PROGRESS NOTE  Lisa Pham ZOX:[id number] DOB: [birth year] DOA: 05/14/2015  PCP: No PCP Per Patient recently moved here from [place]  Brief HPI: 59 year old African-American female with a past medical history of hypertension, hyperlipidemia, depression, presented with left lower quadrant abdominal pain. CT scan showed acute diverticulitis. She was hospitalized for further management.  Past medical history:  Past Medical History  Diagnosis Date  . Hypertension   . COPD (chronic obstructive pulmonary disease)   . High cholesterol   . Heart murmur   . Myocardial infarction ~ 2011    "mild"  . Pneumonia ~ 2013  . B12 deficiency anemia   . GERD (gastroesophageal reflux disease)   . History of stomach ulcers   . Migraine     "3-4 times/wk or more" (05/14/2015)  . Chronic lower back pain   . Arthritis     "all over" (05/14/2015)  . Anxiety   . Depression   . Alcohol withdrawal     Consultants: None  Procedures: None  Antibiotics: Ciprofloxacin and Flagyl  Subjective: Patient feels about the same as yesterday. Pain in the left lower part of the abdomen is about 8 out of 10 in intensity. Denies any nausea, vomiting. Last bowel movement was yesterday.  Objective: Vital Signs  Filed Vitals:   05/14/15 1947 05/14/15 2111 05/14/15 2257 05/15/15 0512  BP: 134/72 101/60 114/50 115/53  Pulse: 56 51 51 48  Temp: 98.1 F (36.7 C)  98.7 F (37.1 C) 98.3 F (36.8 C)  TempSrc: Oral  Oral Oral  Resp: 18 16 18 20   Height:   5\' 4"  (1.626 m)   Weight:   68.6 kg (151 lb 3.8 oz)   SpO2: 98% 93% 98% 99%    Intake/Output Summary (Last 24 hours) at 05/15/15 1130 Last data filed at 05/15/15 0900  Gross per 24 hour  Intake      0 ml  Output    250 ml  Net   -250 ml   Filed Weights   05/14/15 1418 05/14/15 2257  Weight: 70.761 kg (156 lb) 68.6 kg (151 lb 3.8 oz)    General appearance: alert, cooperative, appears stated age and no distress Resp: clear to  auscultation bilaterally Cardio: regular rate and rhythm, S1, S2 normal, no murmur, click, rub or gallop GI: Abdomen is soft. Tender in the left lower quadrant without any rebound, rigidity or guarding. No masses or organomegaly. Bowel sounds are present. Extremities: extremities normal, atraumatic, no cyanosis or edema Neurologic: No focal deficits.  Lab Results:  Basic Metabolic Panel:  Recent Labs Lab 05/14/15 1531 05/15/15 0125 05/15/15 0542  NA 141  --  142  K 3.2*  --  3.4*  CL 114*  --  112*  CO2 23  --  24  GLUCOSE 96  --  92  BUN 7  --  6  CREATININE 0.75 0.84 0.82  CALCIUM 9.1  --  8.9   Liver Function Tests:  Recent Labs Lab 05/14/15 1531 05/15/15 0542  AST 13* 12*  ALT 11* 8*  ALKPHOS 68 57  BILITOT 0.5 0.6  PROT 6.7 6.0*  ALBUMIN 3.5 3.1*   CBC:  Recent Labs Lab 05/14/15 1531 05/15/15 0125 05/15/15 0542  WBC 8.6 7.9 7.3  NEUTROABS 6.3  --  4.4  HGB 13.2 12.4 11.9*  HCT 40.3 38.2 36.3  MCV 91.6 91.6 92.8  PLT 286 282 255     Studies/Results: Ct Abdomen Pelvis W Contrast  05/14/2015   CLINICAL  DATA:  Left lower quadrant pain, nausea, diarrhea  EXAM: CT ABDOMEN AND PELVIS WITH CONTRAST  TECHNIQUE: Multidetector CT imaging of the abdomen and pelvis was performed using the standard protocol following bolus administration of intravenous contrast.  CONTRAST:  OMNIPAQUE IOHEXOL 300 MG/ML  SOLN  COMPARISON:  None.  FINDINGS: Lower chest:  Lung bases are clear.  Hepatobiliary: Liver is within normal limits. No suspicious/enhancing hepatic lesions.  Layering gallstone (series 2/ image 33), without associated inflammatory changes.  Pancreas: Within normal limits.  Spleen: Within normal limits.  Adrenals/Urinary Tract: Adrenal glands are within normal limits.  6 mm cyst in the anterior interpolar right kidney (series 7/ image 17). Left kidney is within normal limits. No hydronephrosis.  Bladder is within normal limits.  Stomach/Bowel: Stomach is within  normal limits.  No evidence of bowel obstruction.  Normal appendix.  Colonic diverticulosis with inflammatory changes in the left lower quadrant (series 2/ image 62), suggesting proximal sigmoid diverticulitis.  Trace pericolonic fluid/stranding. No drainable fluid collection/ abscess. No free air.  Vascular/Lymphatic: Atherosclerotic calcifications of the abdominal aorta and branch vessels.  No suspicious abdominopelvic lymphadenopathy.  Reproductive: Status post hysterectomy.  No adnexal masses.  Other: Trace pelvic ascites.  Musculoskeletal: Degenerative changes of the visualized thoracolumbar spine, most prominent at L5-S1.  IMPRESSION: Sigmoid diverticulitis.  No evidence of perforation.  Trace pelvic ascites.   Electronically Signed   By: Charline Bills M.D.   On: 05/14/2015 18:10    Medications:  Scheduled: . aspirin EC  81 mg Oral Daily  . atorvastatin  40 mg Oral Daily  . ciprofloxacin  400 mg Intravenous Q12H  . enoxaparin (LOVENOX) injection  40 mg Subcutaneous QHS  . gabapentin  600 mg Oral TID  . metronidazole  500 mg Intravenous Q8H  . tiotropium  18 mcg Inhalation Daily  . topiramate  50 mg Oral BID  . venlafaxine  25 mg Oral BID   Continuous: . 0.9 % NaCl with KCl 20 mEq / L 100 mL/hr at 05/15/15 0033   ZOX:WRUEAVWU injection, ondansetron **OR** ondansetron (ZOFRAN) IV  Assessment/Plan:  Active Problems:   Diverticulitis   Acute diverticulitis    Acute sigmoid diverticulitis Continue IV antibiotics with Cipro and Flagyl. Continue IV fluids. Ice chips only for now. Pain medications as needed. Correct electrolytes.  History of migraine headaches Resume her home medications including Topamax.  History of COPD Continue Spiriva  Hypokalemia Replete potassium  DVT Prophylaxis: Lovenox    Code Status: Full code  Family Communication: Discussed with the patient  Disposition Plan: Not ready for discharge. Await improvement.  Follow-up Appointment?: May need to  be set up with a PCP as she is new to this area.   LOS: 1 day   [facility]  [facility] Pager [phone] 05/15/2015, 11:30 AM  If 7PM-7AM, please contact night-coverage at [email or web address], password [facility]

## 2015-05-15 NOTE — Care Management (Signed)
Received communication from [employer], RN that patient just recently moved to [facility] from [place]. Patient had [facility] and does not have a PCP. Met with patient at bedside to discuss [place]. Patient interested in establishing care with clinic after discharge but wants to discuss with her daughter before appointment made.  Patient given clinic contact information.  Carles Collet, RN CM updated.

## 2015-05-15 NOTE — Progress Notes (Signed)
Spoke with Marcelino Duster, Artist, about patient's out of state [employer]. In order for patient to have [place] Medicaid she would need to cancel her [place] policy and take her disability papers to [facility] DSS office and apply for [place] Medicaid. Patient has no PCP locally, moved here two weeks ago. Will pursue [facility] for follow up care and medication help at time of discharge.

## 2015-05-15 NOTE — Care Management Note (Signed)
Case Management Note  Patient Details  Name: Kensleigh Gates MRN: [id number] Date of Birth: 10-28-1955  Subjective/Objective:                 Patient from out of state, [place]. Has out of state medicaid (see previous note). Referred to Peterson Lombard [facility] for follow up. Patient is staying with family. Here for diverticulitis.   Action/Plan:  Will continue to follow.  Expected Discharge Date:                  Expected Discharge Plan:  Home/Self Care  In-House Referral:     Discharge planning Services  CM Consult, Indigent Health Clinic  Post Acute Care Choice:    Choice offered to:     DME Arranged:    DME Agency:     HH Arranged:    HH Agency:     Status of Service:  In process, will continue to follow  Medicare Important Message Given:    Date Medicare IM Given:    Medicare IM give by:    Date Additional Medicare IM Given:    Additional Medicare Important Message give by:     If discussed at Long Length of Stay Meetings, dates discussed:    Additional Comments:  Lawerance Sabal, RN 05/15/2015, 1:55 PM

## 2015-05-15 NOTE — Progress Notes (Signed)
Consulted Peterson Lombard RN CM [facility] to help coordinate care and follow up after discharge.

## 2015-05-16 LAB — CBC
HEMATOCRIT: 36.8 % (ref 36.0–46.0)
HEMOGLOBIN: 11.9 g/dL — AB (ref 12.0–15.0)
MCH: 30 pg (ref 26.0–34.0)
MCHC: 32.3 g/dL (ref 30.0–36.0)
MCV: 92.7 fL (ref 78.0–100.0)
Platelets: 242 10*3/uL (ref 150–400)
RBC: 3.97 MIL/uL (ref 3.87–5.11)
RDW: 13.2 % (ref 11.5–15.5)
WBC: 5.4 10*3/uL (ref 4.0–10.5)

## 2015-05-16 LAB — MAGNESIUM: Magnesium: 1.8 mg/dL (ref 1.7–2.4)

## 2015-05-16 LAB — BASIC METABOLIC PANEL
Anion gap: 5 (ref 5–15)
BUN: 6 mg/dL (ref 6–20)
CO2: 20 mmol/L — ABNORMAL LOW (ref 22–32)
Calcium: 9 mg/dL (ref 8.9–10.3)
Chloride: 116 mmol/L — ABNORMAL HIGH (ref 101–111)
Creatinine, Ser: 0.8 mg/dL (ref 0.44–1.00)
GFR calc Af Amer: >60 mL/min (ref >60)
Glucose, Bld: 87 mg/dL (ref 65–99)
POTASSIUM: 4 mmol/L (ref 3.5–5.1)
Sodium: 141 mmol/L (ref 135–145)

## 2015-05-16 NOTE — Care Management (Signed)
This Case Manager received call from Lawerance Sabal that patient wanting hospital follow-up appointment at [facility] and [facility]. Wednesday preferred day.  Hospital follow-up appointment obtained on 05/22/15 at 0915 with Dr. Venetia Night.  Appointment placed on AVS. Lawerance Sabal, RN CM updated.

## 2015-05-16 NOTE — Progress Notes (Signed)
[facility] PROGRESS NOTE  Lisa Pham ZOX:[id number] DOB: 24-Jun-1956 DOA: 05/14/2015  PCP: No PCP Per Patient recently moved here from [place]  Brief HPI: 59 year old African-American female with a past medical history of hypertension, hyperlipidemia, depression, presented with left lower quadrant abdominal pain. CT scan showed acute diverticulitis. She was hospitalized for further management.  Past medical history:  Past Medical History  Diagnosis Date  . Hypertension   . COPD (chronic obstructive pulmonary disease)   . High cholesterol   . Heart murmur   . Myocardial infarction ~ 2011    "mild"  . Pneumonia ~ 2013  . B12 deficiency anemia   . GERD (gastroesophageal reflux disease)   . History of stomach ulcers   . Migraine     "3-4 times/wk or more" (05/14/2015)  . Chronic lower back pain   . Arthritis     "all over" (05/14/2015)  . Anxiety   . Depression   . Alcohol withdrawal     Consultants: None  Procedures: None  Antibiotics: Ciprofloxacin and Flagyl  Subjective: Patient starting to feel better. Abdominal Pain is improving. Denies any nausea, vomiting. Had a small bowel movement this morning which was soft.  Objective: Vital Signs  Filed Vitals:   05/15/15 0512 05/15/15 1431 05/15/15 2139 05/16/15 0529  BP: 115/53 113/63 132/54 130/66  Pulse: 48 50 48 55  Temp: 98.3 F (36.8 C) 98.8 F (37.1 C) 99 F (37.2 C) 98.7 F (37.1 C)  TempSrc: Oral Oral Oral Oral  Resp: Height:      Weight:      SpO2: 99% 99% 95% 97%    Intake/Output Summary (Last 24 hours) at 05/16/15 1109 Last data filed at 05/16/15 1048  Gross per 24 hour  Intake   1000 ml  Output    260 ml  Net    740 ml   Filed Weights   05/14/15 1418 05/14/15 2257  Weight: 70.761 kg (156 lb) 68.6 kg (151 lb 3.8 oz)    General appearance: alert, cooperative, appears stated age and no distress Resp: clear to auscultation bilaterally Cardio: regular rate and rhythm,  S1, S2 normal, no murmur, click, rub or gallop GI: Abdomen is soft. Less Tender in the left lower quadrant compared to yesterday, without any rebound, rigidity or guarding. No masses or organomegaly. Bowel sounds are present. Extremities: extremities normal, atraumatic, no cyanosis or edema Neurologic: No focal deficits.  Lab Results:  Basic Metabolic Panel:  Recent Labs Lab 05/14/15 1531 05/15/15 0125 05/15/15 0542 05/16/15 0530  NA 141  --  142 141  K 3.2*  --  3.4* 4.0  CL 114*  --  112* 116*  CO2 23  --  24 20*  GLUCOSE 96  --  92 87  BUN 7  --  6 6  CREATININE 0.75 0.84 0.82 0.80  CALCIUM 9.1  --  8.9 9.0  MG  --   --   --  1.8   Liver Function Tests:  Recent Labs Lab 05/14/15 1531 05/15/15 0542  AST 13* 12*  ALT 11* 8*  ALKPHOS 68 57  BILITOT 0.5 0.6  PROT 6.7 6.0*  ALBUMIN 3.5 3.1*   CBC:  Recent Labs Lab 05/14/15 1531 05/15/15 0125 05/15/15 0542 05/16/15 0530  WBC 8.6 7.9 7.3 5.4  NEUTROABS 6.3  --  4.4  --   HGB 13.2 12.4 11.9* 11.9*  HCT 40.3 38.2 36.3 36.8  MCV 91.6 91.6 92.8 92.7  PLT  PrMcarthur Rossetti KentuckymanMayra Ottie Glaziernnecticut Childbirth & Women'S Center[address] MarylandDr.[phone] nnett County Health CenteClearTomasa Ho75ste [address] stiana KentuckyPellant337 TEXTTAG>[facility] [phone]TransMontaigneGeneral [phone].comAquilla Hacker ora [id number] 101Tomasa HostellerColonial HeightsSt Joseph'S Hospital Health CenterPremier Asc LLC KentuckyChristiana [phone] [phone] WatongaMylo Red Ottie GlazierKentuckyMayra NeerMcarthur RossettiProduct [id number] [phone]TransMontaigneGeneral [phone].comAquilla Hacker Tora [id number] 63Tomasa HostellerMeekerEast Side Surgery CenterChristus St [facility] KentuckyChristiana [phone] [phone] Georgia Health System - Camden CampusMylo Red Ottie GlazierKentuckyMayra NeerMcarthur RossettiProduct [id number] [phone]

## 2015-05-16 NOTE — Care Management Note (Signed)
Case Management Note  Patient Details  Name: Lisa Pham MRN: [id number] Date of Birth: 02-Jan-1956  Subjective/Objective:                 Patient from out of state, [place]. Has out of state medicaid (see previous note). Referred to Peterson Lombard [facility] for follow up. Patient is staying with family. Here for diverticulitis.     Action/Plan:  Spoke with patient about Follow up appointment at [facility]. Patient received pamphlet from [facility]. CM explained to patient that they may use the on site pharmacy to fill prescriptions given to them at discharge. Patient aware that the [facility] pharmacy will not fill narcotics or pain medications prior to the patient being seen by one of their physicians.  Patient aware that they must be seen as a patient prior to the pharmacy filling the prescriptions a second time. Patient stated that daughter requested Wednesday AM appointment time, CM passed this info onto Peterson Lombard RN CM [facility] to make appointment. Patient given inormation CM received from financial counselor on how o get [place] Medicaid. CM explained it and wrote it down so she could share it with her daughter. I provided patient with contact information so daughter could contact me if she has any questions.   Expected Discharge Date:                  Expected Discharge Plan:  Home/Self Care  In-House Referral:     Discharge planning Services  CM Consult, Indigent Health Clinic  Post Acute Care Choice:    Choice offered to:     DME Arranged:    DME Agency:     HH Arranged:    HH Agency:     Status of Service:  In process, will continue to follow  Medicare Important Message Given:    Date Medicare IM Given:    Medicare IM give by:    Date Additional Medicare IM Given:    Additional Medicare Important Message give by:     If discussed at Long Length of Stay Meetings, dates discussed:    Additional Comments:  Lawerance Sabal, RN 05/16/2015,  12:00 PM

## 2015-05-17 DIAGNOSIS — I10 Essential (primary) hypertension: Secondary | ICD-10-CM

## 2015-05-17 MED ORDER — METRONIDAZOLE 500 MG PO TABS
500.0000 mg | ORAL_TABLET | Freq: Three times a day (TID) | ORAL | Status: DC
  Administered 2015-05-17 – 2015-05-18 (×4): 500 mg via ORAL
  Filled 2015-05-17 (×4): qty 1

## 2015-05-17 MED ORDER — CIPROFLOXACIN HCL 500 MG PO TABS
500.0000 mg | ORAL_TABLET | Freq: Two times a day (BID) | ORAL | Status: DC
Start: 2015-05-17 — End: 2015-05-18
  Administered 2015-05-17 – 2015-05-18 (×2): 500 mg via ORAL
  Filled 2015-05-17 (×2): qty 1

## 2015-05-17 MED ORDER — AMLODIPINE BESYLATE 10 MG PO TABS
10.0000 mg | ORAL_TABLET | Freq: Every day | ORAL | Status: DC
  Administered 2015-05-17 – 2015-05-18 (×2): 10 mg via ORAL
  Filled 2015-05-17 (×2): qty 1

## 2015-05-17 NOTE — Progress Notes (Addendum)
[facility] PROGRESS NOTE  Lisa Pham ZOX:[id number] DOB: Jan 06, 1956 DOA: 05/14/2015  PCP: No PCP Per Patient recently moved here from [place]  Brief HPI: 59 year old African-American female with a past medical history of hypertension, hyperlipidemia, depression, presented with left lower quadrant abdominal pain. CT scan showed acute diverticulitis. She was hospitalized for further management.  Past medical history:  Past Medical History  Diagnosis Date  . Hypertension   . COPD (chronic obstructive pulmonary disease)   . High cholesterol   . Heart murmur   . Myocardial infarction ~ 2011    "mild"  . Pneumonia ~ 2013  . B12 deficiency anemia   . GERD (gastroesophageal reflux disease)   . History of stomach ulcers   . Migraine     "3-4 times/wk or more" (05/14/2015)  . Chronic lower back pain   . Arthritis     "all over" (05/14/2015)  . Anxiety   . Depression   . Alcohol withdrawal     Consultants: None  Procedures: None  Antibiotics: Ciprofloxacin and Flagyl  Subjective: Patient tolerated a liquid diet. No worsening in her pain. Did have a couple of loose stools yesterday and did have some cramping associated with it. No nausea, vomiting. Wants her diet to be advanced.  Objective: Vital Signs  Filed Vitals:   05/16/15 0529 05/16/15 1313 05/16/15 2200 05/17/15 0500  BP: 130/66 137/69 150/83 160/76  Pulse: 55 49 50 52  Temp: 98.7 F (37.1 C) 98.6 F (37 C) 98.2 F (36.8 C) 98.2 F (36.8 C)  TempSrc: Oral Oral Oral Oral  Resp: Height:      Weight:      SpO2: 97% 95% 100% 97%    Intake/Output Summary (Last 24 hours) at 05/17/15 1135 Last data filed at 05/17/15 0954  Gross per 24 hour  Intake 2695.83 ml  Output   1300 ml  Net 1395.83 ml   Filed Weights   05/14/15 1418 05/14/15 2257  Weight: 70.761 kg (156 lb) 68.6 kg (151 lb 3.8 oz)    General appearance: alert, cooperative, appears stated age and no distress Resp: clear to  auscultation bilaterally Cardio: regular rate and rhythm, S1, S2 normal, no murmur, click, rub or gallop GI: Abdomen is soft. Nontender today. No masses or organomegaly. Bowel sounds are present. Extremities: extremities normal, atraumatic, no cyanosis or edema Neurologic: No focal deficits.  Lab Results:  Basic Metabolic Panel:  Recent Labs Lab 05/14/15 1531 05/15/15 0125 05/15/15 0542 05/16/15 0530  NA 141  --  142 141  K 3.2*  --  3.4* 4.0  CL 114*  --  112* 116*  CO2 23  --  24 20*  GLUCOSE 96  --  92 87  BUN 7  --  6 6  CREATININE 0.75 0.84 0.82 0.80  CALCIUM 9.1  --  8.9 9.0  MG  --   --   --  1.8   Liver Function Tests:  Recent Labs Lab 05/14/15 1531 05/15/15 0542  AST 13* 12*  ALT 11* 8*  ALKPHOS 68 57  BILITOT 0.5 0.6  PROT 6.7 6.0*  ALBUMIN 3.5 3.1*   CBC:  Recent Labs Lab 05/14/15 1531 05/15/15 0125 05/15/15 0542 05/16/15 0530  WBC 8.6 7.9 7.3 5.4  NEUTROABS 6.3  --  4.4  --   HGB 13.2 12.4 11.9* 11.9*  HCT 40.3 38.2 36.3 36.8  MCV 91.6 91.6 92.8 92.7  PLT 286 282 255 242  Studies/Results: No results found.  Medications:  Scheduled: . aspirin EC  81 mg Oral Daily  . atorvastatin  40 mg Oral Daily  . ciprofloxacin  500 mg Oral BID  . enoxaparin (LOVENOX) injection  40 mg Subcutaneous QHS  . gabapentin  600 mg Oral TID  . metroNIDAZOLE  500 mg Oral 3 times per day  . tiotropium  18 mcg Inhalation Daily  . topiramate  50 mg Oral BID  . venlafaxine  25 mg Oral BID   Continuous: . 0.9 % NaCl with KCl 20 mEq / L 75 mL/hr at 05/16/15 2320   WGN:FAOZHYQM injection, ondansetron **OR** ondansetron (ZOFRAN) IV  Assessment/Plan:  Active Problems:   Diverticulitis   Acute diverticulitis    Acute sigmoid diverticulitis Patient is improving. Advance diet to full liquids. Change her antibiotics to oral. Cutback on IV fluids. Pain medications as needed.   History of migraine headaches Continue Topamax.  History of COPD Continue  Spiriva. Stable  Hypokalemia Repleted  History of essential hypertension Blood pressure is creeping up. Resume her amlodipine. Noted to be bradycardic. Not on any rate limiting drugs. Asymptomatic. Sinus rhythm.  DVT Prophylaxis: Lovenox    Code Status: Full code  Family Communication: Discussed with the patient  Disposition Plan: Patient improving. Diet to be advanced today. Start mobilizing.  Follow-up Appointment?: Being set up with community health and wellness clinic   LOS: 3 days   [facility]  [facility] Pager [phone] 05/17/2015, 11:35 AM  If 7PM-7AM, please contact night-coverage at [email or web address], password [facility]

## 2015-05-17 NOTE — Progress Notes (Signed)
Utilization Review completed. Aristide Waggle RN BSN CM 

## 2015-05-18 LAB — BASIC METABOLIC PANEL
ANION GAP: 3 — AB (ref 5–15)
BUN: <5 mg/dL — ABNORMAL LOW (ref 6–20)
CALCIUM: 9.1 mg/dL (ref 8.9–10.3)
CO2: 23 mmol/L (ref 22–32)
CREATININE: 0.75 mg/dL (ref 0.44–1.00)
Chloride: 116 mmol/L — ABNORMAL HIGH (ref 101–111)
GFR calc Af Amer: >60 mL/min (ref >60)
GFR calc non Af Amer: >60 mL/min (ref >60)
GLUCOSE: 101 mg/dL — AB (ref 65–99)
POTASSIUM: 3.5 mmol/L (ref 3.5–5.1)
Sodium: 142 mmol/L (ref 135–145)

## 2015-05-18 MED ORDER — OXYCODONE HCL 5 MG PO TABS: 5.0000 mg | ORAL_TABLET | ORAL | Status: DC | PRN

## 2015-05-18 MED ORDER — OXYCODONE HCL 5 MG PO TABS
5.0000 mg | ORAL_TABLET | ORAL | Status: DC | PRN
  Administered 2015-05-18: 5 mg via ORAL
  Filled 2015-05-18: qty 1

## 2015-05-18 MED ORDER — CIPROFLOXACIN HCL 500 MG PO TABS: 500.0000 mg | ORAL_TABLET | Freq: Two times a day (BID) | ORAL | Status: DC

## 2015-05-18 MED ORDER — METRONIDAZOLE 500 MG PO TABS: 500.0000 mg | ORAL_TABLET | Freq: Three times a day (TID) | ORAL | Status: DC

## 2015-05-18 NOTE — Discharge Summary (Signed)
[facility]  Physician Discharge Summary   Patient ID: Lisa Pham MRN: [id number] DOB/AGE: 06/26/1956 59 y.o.  Admit date: 05/14/2015 Discharge date: 05/18/2015  PCP: [facility]  DISCHARGE DIAGNOSES:  Active Problems:   Diverticulitis   Acute diverticulitis   RECOMMENDATIONS FOR OUTPATIENT FOLLOW UP: 1. Please review need for colonoscopy at follow-up visit 2. Right renal cyst noted incidentally on CT   DISCHARGE CONDITION: fair  Diet recommendation: Soft diet  Filed Weights   05/14/15 1418 05/14/15 2257  Weight: 70.761 kg (156 lb) 68.6 kg (151 lb 3.8 oz)    INITIAL HISTORY: 59 year old African-American female with a past medical history of hypertension, hyperlipidemia, depression, presented with left lower quadrant abdominal pain. CT scan showed acute diverticulitis. She was hospitalized for further management.   HOSPITAL COURSE:   Acute sigmoid diverticulitis Patient was admitted to the hospital. She was placed on IV antibiotics. She was kept nothing by mouth initially. As her symptoms improved she was started on a diet. Antibiotics were changed over to oral. She has been ambulating without difficulties. Does experience some abdominal cramping whenever she has a bowel movement. I have reassured the patient and told her the symptoms will take a few more days to subside. In the meantime, she is tolerating a diet without any nausea or vomiting. Pain is reasonably well controlled with oral medications. Case manager to assist with medications prior to discharge. She did mention that she has had colonoscopy in the past done in [place]. She will need to discuss this further with her outpatient providers.  History of migraine headaches Continue Topamax.  History of COPD Continue Spiriva. Stable  Hypokalemia Repleted  History of essential hypertension She may resume her home medication regimen. Noted to  be in sinus bradycardia.   Overall, stable. Okay for discharge today.  PERTINENT LABS:  The results of significant diagnostics from this hospitalization (including imaging, microbiology, ancillary and laboratory) are listed below for reference.     Labs: Basic Metabolic Panel:  Recent Labs Lab 05/14/15 1531 05/15/15 0125 05/15/15 0542 05/16/15 0530 05/18/15 0443  NA 141  --  142 141 142  K 3.2*  --  3.4* 4.0 3.5  CL 114*  --  112* 116* 116*  CO2 23  --  24 20* 23  GLUCOSE 96  --  92 87 101*  BUN 7  --  6 6 <5*  CREATININE 0.75 0.84 0.82 0.80 0.75  CALCIUM 9.1  --  8.9 9.0 9.1  MG  --   --   --  1.8  --    Liver Function Tests:  Recent Labs Lab 05/14/15 1531 05/15/15 0542  AST 13* 12*  ALT 11* 8*  ALKPHOS 68 57  BILITOT 0.5 0.6  PROT 6.7 6.0*  ALBUMIN 3.5 3.1*   CBC:  Recent Labs Lab 05/14/15 1531 05/15/15 0125 05/15/15 0542 05/16/15 0530  WBC 8.6 7.9 7.3 5.4  NEUTROABS 6.3  --  4.4  --   HGB 13.2 12.4 11.9* 11.9*  HCT 40.3 38.2 36.3 36.8  MCV 91.6 91.6 92.8 92.7  PLT 286 282 255 242    IMAGING STUDIES Ct Abdomen Pelvis W Contrast  05/14/2015   CLINICAL DATA:  Left lower quadrant pain, nausea, diarrhea  EXAM: CT ABDOMEN AND PELVIS WITH CONTRAST  TECHNIQUE: Multidetector CT imaging of the abdomen and pelvis was performed using the standard protocol following bolus administration of intravenous contrast.  CONTRAST:  OMNIPAQUE  IOHEXOL 300 MG/ML  SOLN  COMPARISON:  None.  FINDINGS: Lower chest:  Lung bases are clear.  Hepatobiliary: Liver is within normal limits. No suspicious/enhancing hepatic lesions.  Layering gallstone (series 2/ image 33), without associated inflammatory changes.  Pancreas: Within normal limits.  Spleen: Within normal limits.  Adrenals/Urinary Tract: Adrenal glands are within normal limits.  6 mm cyst in the anterior interpolar right kidney (series 7/ image 17). Left kidney is within normal limits. No hydronephrosis.  Bladder is  within normal limits.  Stomach/Bowel: Stomach is within normal limits.  No evidence of bowel obstruction.  Normal appendix.  Colonic diverticulosis with inflammatory changes in the left lower quadrant (series 2/ image 62), suggesting proximal sigmoid diverticulitis.  Trace pericolonic fluid/stranding. No drainable fluid collection/ abscess. No free air.  Vascular/Lymphatic: Atherosclerotic calcifications of the abdominal aorta and branch vessels.  No suspicious abdominopelvic lymphadenopathy.  Reproductive: Status post hysterectomy.  No adnexal masses.  Other: Trace pelvic ascites.  Musculoskeletal: Degenerative changes of the visualized thoracolumbar spine, most prominent at L5-S1.  IMPRESSION: Sigmoid diverticulitis.  No evidence of perforation.  Trace pelvic ascites.   Electronically Signed   By: Charline Bills M.D.   On: 05/14/2015 18:10    DISCHARGE EXAMINATION: Filed Vitals:   05/17/15 2234 05/18/15 0645 05/18/15 1103 05/18/15 1156  BP: 144/77 132/68 143/71   Pulse: 52 49 52   Temp: 98 F (36.7 C) 98.2 F (36.8 C)    TempSrc: Oral Oral    Resp: 16 16    Height:      Weight:      SpO2: 100% 100%  94%   General appearance: alert, cooperative, appears stated age and no distress Resp: clear to auscultation bilaterally Cardio: regular rate and rhythm, S1, S2 normal, no murmur, click, rub or gallop GI: Soft. Mildly tender in the left lower quadrant without any rebound, rigidity or guarding. No masses or organomegaly. Bowel sounds are present.  DISPOSITION: Home  Discharge Instructions    Call MD for:  extreme fatigue    Complete by:  As directed      Call MD for:  persistant dizziness or light-headedness    Complete by:  As directed      Call MD for:  persistant nausea and vomiting    Complete by:  As directed      Call MD for:  severe uncontrolled pain    Complete by:  As directed      Call MD for:  temperature >100.4    Complete by:  As directed      Discharge instructions     Complete by:  As directed   Please follow-up with the [facility] as scheduled. Please eat soft diet for the next few days. Avoid constipation. If you have not had a colonoscopy within the last 10 years you will need one in the next few weeks. Please talk to your outpatient provider regarding same.    You were cared for by a hospitalist during your hospital stay. If you have any questions about your discharge medications or the care you received while you were in the hospital after you are discharged, you can call the unit and asked to speak with the hospitalist on call if the hospitalist that took care of you is not available. Once you are discharged, your primary care physician will handle any further medical issues. Please note that NO REFILLS for any discharge medications will be authorized once you are discharged, as it is  imperative that you return to your primary care physician (or establish a relationship with a primary care physician if you do not have one) for your aftercare needs so that they can reassess your need for medications and monitor your lab values. If you do not have a primary care physician, you can call [phone] for a physician referral.     Increase activity slowly    Complete by:  As directed            ALLERGIES:  Allergies  Allergen Reactions  . Latex Hives and Itching  . Penicillins Other (See Comments)    Yeast infection  . Nicotine Rash    The adhesive in nicotine patch causes her to break out.     Current Discharge Medication List    START taking these medications   Details  ciprofloxacin (CIPRO) 500 MG tablet Take 1 tablet (500 mg total) by mouth 2 (two) times daily. For 10 more days Qty: 20 tablet, Refills: 0    metroNIDAZOLE (FLAGYL) 500 MG tablet Take 1 tablet (500 mg total) by mouth every 8 (eight) hours. For 10 more days Qty: 30 tablet, Refills: 0    oxyCODONE (OXY IR/ROXICODONE) 5 MG immediate release tablet Take 1-2  tablets (5-10 mg total) by mouth every 4 (four) hours as needed for moderate pain. Qty: 30 tablet, Refills: 0      CONTINUE these medications which have NOT CHANGED   Details  albuterol (PROVENTIL HFA;VENTOLIN HFA) 108 (90 BASE) MCG/ACT inhaler Inhale 1-2 puffs into the lungs every 6 (six) hours as needed for wheezing or shortness of breath.    amLODipine (NORVASC) 10 MG tablet Take 10 mg by mouth daily.    aspirin-acetaminophen-caffeine (EXCEDRIN MIGRAINE) 250-250-65 MG per tablet Take 2 tablets by mouth every 6 (six) hours as needed for migraine.    atorvastatin (LIPITOR) 40 MG tablet Take 40 mg by mouth daily.    gabapentin (NEURONTIN) 600 MG tablet Take 600 mg by mouth 3 (three) times daily.    losartan (COZAAR) 100 MG tablet Take 100 mg by mouth daily.    montelukast (SINGULAIR) 10 MG tablet Take 10 mg by mouth at bedtime.    tiotropium (SPIRIVA) 18 MCG inhalation capsule Place 18 mcg into inhaler and inhale daily.    topiramate (TOPAMAX) 50 MG tablet Take 50 mg by mouth 2 (two) times daily.    traZODone (DESYREL) 100 MG tablet Take 100 mg by mouth at bedtime as needed for sleep.    venlafaxine (EFFEXOR) 25 MG tablet Take 25 mg by mouth 2 (two) times daily.    vitamin B-12 (CYANOCOBALAMIN) 1000 MCG tablet Take 1,000 mcg by mouth daily.    Vitamin D, Ergocalciferol, (DRISDOL) 50000 UNITS CAPS capsule Take 50,000 Units by mouth every 7 (seven) days. Sunday       Follow-up Information    Follow up with [facility].   Contact information:   [address] [place] [address] [phone]      Follow up with [facility]     On 05/22/2015.   Why:  Appointment on 05/22/15 at 9:15 am with Dr. Venetia Night.   Contact information:   [address] [place] [address] [phone]      TOTAL DISCHARGE TIME: 35 minutes  [facility]  [facility] Pager  [phone]  05/18/2015, 3:10 PM

## 2015-05-18 NOTE — Care Management Note (Signed)
Case Management Note  Patient Details  Name: Lisa Pham MRN: [id number] Date of Birth: April 19, 1956  Subjective/Objective:                   acute diverticulitis. Action/Plan: Discharge planning  Expected Discharge Date:  05/18/15               Expected Discharge Plan:  Home/Self Care  In-House Referral:     Discharge planning Services  CM Consult, [facility], [facility] Program, Medication Assistance  Post Acute Care Choice:    Choice offered to:     DME Arranged:    DME Agency:     HH Arranged:    HH Agency:     Status of Service:  Completed, signed off  Medicare Important Message Given:    Date Medicare IM Given:    Medicare IM give by:    Date Additional Medicare IM Given:    Additional Medicare Important Message give by:     If discussed at [place] of Stay Meetings, dates discussed:    Additional Comments: CM met with pt in room and gave pt [place] letter with list of participating pharmacies.  Pt is set up with [facility] but not open until Monday.  Pt needs meds today.  Pt verbalized understanding of all MATCH parameters and is going to [facility] so her pain medication will be affordable.  No other CM needs were communicated.  Dellie Catholic, RN 05/18/2015, 3:32 PM

## 2015-05-18 NOTE — Discharge Instructions (Signed)

## 2015-05-18 NOTE — Progress Notes (Signed)
Pt. Received discharge instructions and prescriptions. Educated pt. On follow-up appointments and importance of completing antibiotic therapy. Removed IV. No new skin issues noted. All questions answered. No further needs noted at this time.

## 2015-05-22 ENCOUNTER — Encounter: Payer: Self-pay | Admitting: Family Medicine

## 2015-05-22 ENCOUNTER — Ambulatory Visit: Payer: Medicaid - Out of State | Attending: Family Medicine | Admitting: Family Medicine

## 2015-05-22 VITALS — BP 154/92 | HR 67 | Temp 98.4°F | Ht 64.0 in | Wt 150.0 lb

## 2015-05-22 DIAGNOSIS — K219 Gastro-esophageal reflux disease without esophagitis: Secondary | ICD-10-CM | POA: Insufficient documentation

## 2015-05-22 DIAGNOSIS — J439 Emphysema, unspecified: Secondary | ICD-10-CM | POA: Diagnosis not present

## 2015-05-22 DIAGNOSIS — J449 Chronic obstructive pulmonary disease, unspecified: Secondary | ICD-10-CM | POA: Insufficient documentation

## 2015-05-22 DIAGNOSIS — F17210 Nicotine dependence, cigarettes, uncomplicated: Secondary | ICD-10-CM | POA: Insufficient documentation

## 2015-05-22 DIAGNOSIS — E780 Pure hypercholesterolemia: Secondary | ICD-10-CM | POA: Insufficient documentation

## 2015-05-22 DIAGNOSIS — Z79899 Other long term (current) drug therapy: Secondary | ICD-10-CM | POA: Insufficient documentation

## 2015-05-22 DIAGNOSIS — G43909 Migraine, unspecified, not intractable, without status migrainosus: Secondary | ICD-10-CM | POA: Insufficient documentation

## 2015-05-22 DIAGNOSIS — I10 Essential (primary) hypertension: Secondary | ICD-10-CM | POA: Insufficient documentation

## 2015-05-22 DIAGNOSIS — K5792 Diverticulitis of intestine, part unspecified, without perforation or abscess without bleeding: Secondary | ICD-10-CM | POA: Insufficient documentation

## 2015-05-22 DIAGNOSIS — F419 Anxiety disorder, unspecified: Secondary | ICD-10-CM | POA: Insufficient documentation

## 2015-05-22 DIAGNOSIS — F32A Depression, unspecified: Secondary | ICD-10-CM

## 2015-05-22 DIAGNOSIS — F329 Major depressive disorder, single episode, unspecified: Secondary | ICD-10-CM

## 2015-05-22 DIAGNOSIS — G43009 Migraine without aura, not intractable, without status migrainosus: Secondary | ICD-10-CM

## 2015-05-22 MED ORDER — FLUTICASONE PROPIONATE 50 MCG/ACT NA SUSP: 1.0000 | Freq: Every day | NASAL | Status: DC

## 2015-05-22 MED ORDER — TRAZODONE HCL 100 MG PO TABS: 100.0000 mg | ORAL_TABLET | Freq: Every evening | ORAL | Status: DC | PRN

## 2015-05-22 MED ORDER — VENLAFAXINE HCL 25 MG PO TABS: 25.0000 mg | ORAL_TABLET | Freq: Two times a day (BID) | ORAL | Status: DC

## 2015-05-22 MED ORDER — MONTELUKAST SODIUM 10 MG PO TABS: 10.0000 mg | ORAL_TABLET | Freq: Every day | ORAL | Status: DC

## 2015-05-22 MED ORDER — TIOTROPIUM BROMIDE MONOHYDRATE 18 MCG IN CAPS: 18.0000 ug | ORAL_CAPSULE | Freq: Every day | RESPIRATORY_TRACT | Status: DC

## 2015-05-22 MED ORDER — GABAPENTIN 600 MG PO TABS: 600.0000 mg | ORAL_TABLET | Freq: Three times a day (TID) | ORAL | Status: DC

## 2015-05-22 MED ORDER — TOPIRAMATE 50 MG PO TABS: 50.0000 mg | ORAL_TABLET | Freq: Two times a day (BID) | ORAL | Status: DC

## 2015-05-22 MED ORDER — AMLODIPINE BESYLATE 10 MG PO TABS: 10.0000 mg | ORAL_TABLET | Freq: Every day | ORAL | Status: DC

## 2015-05-22 MED ORDER — ATORVASTATIN CALCIUM 40 MG PO TABS: 40.0000 mg | ORAL_TABLET | Freq: Every day | ORAL | Status: DC

## 2015-05-22 MED ORDER — LOSARTAN POTASSIUM 100 MG PO TABS: 100.0000 mg | ORAL_TABLET | Freq: Every day | ORAL | Status: DC

## 2015-05-22 NOTE — Patient Instructions (Signed)

## 2015-05-22 NOTE — Progress Notes (Signed)
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

## 2015-05-22 NOTE — Progress Notes (Signed)
Lisa Pham, is a 59 y.o. female  ZOX:[id number]  WJX:[id number]  DOB - [birth year]  Admit date: 05/14/15 Discharge date: 05/18/15  CC:  Chief Complaint  Patient presents with  . Hospitalization Follow-up  . Diverticulitis       HPI: Lisa Pham is a 59 y.o. female who recently relocated from [place] a few weeks ago to [facility] and is here today to establish medical care. Medical history is notable for hypertension, COPD, GERD, depression, migraines. She was admitted to [facility] from 05/14/15-05/18/15 after she had presented via the ED with left lower abdominal pain and CT findings revealed acute diverticulitis. She was placed nothing by mouth and commenced on IV Flagyl and Cipro and was also placed on analgesics for pain control. She was noted to have hypokalemia of 3.2 which was repleted. She did have some abdominal cramping which gradually improved and her diet was advanced as tolerated. She was transitioned to oral antibiotics and discharged once her symptoms resolved and was advised to follow-up with the clinic.  Interval history: She denies blood in stools and still has some abdominal pain which is a 7 out of 10. Tolerating already and denies nausea or vomiting she is stressed out and depressed due to the fact that she resides under poor living conditions with 9 of the people in the house and she has also had a hard time trying to transfer her disability from [place] down to [place]. Denies suicidal ideation.  Takes albuterol MDI for COPD and is doing well on it; she is requesting refills of all her medications   Allergies  Allergen Reactions  . Latex Hives and Itching  . Penicillins Other (See Comments)    Yeast infection  . Nicotine Rash    The adhesive in nicotine patch causes her to break out.   Past Medical History  Diagnosis Date  . Hypertension   . COPD (chronic obstructive pulmonary disease)   . High cholesterol   . Heart murmur   .  Myocardial infarction ~ 2011    "mild"  . Pneumonia ~ 2013  . B12 deficiency anemia   . GERD (gastroesophageal reflux disease)   . History of stomach ulcers   . Migraine     "3-4 times/wk or more" (05/14/2015)  . Chronic lower back pain   . Arthritis     "all over" (05/14/2015)  . Anxiety   . Depression   . Alcohol withdrawal    Current Outpatient Prescriptions on File Prior to Visit  Medication Sig Dispense Refill  . albuterol (PROVENTIL HFA;VENTOLIN HFA) 108 (90 BASE) MCG/ACT inhaler Inhale 1-2 puffs into the lungs every 6 (six) hours as needed for wheezing or shortness of breath.    Marland Kitchen aspirin-acetaminophen-caffeine (EXCEDRIN MIGRAINE) 250-250-65 MG per tablet Take 2 tablets by mouth every 6 (six) hours as needed for migraine.    . ciprofloxacin (CIPRO) 500 MG tablet Take 1 tablet (500 mg total) by mouth 2 (two) times daily. For 10 more days 20 tablet 0  . metroNIDAZOLE (FLAGYL) 500 MG tablet Take 1 tablet (500 mg total) by mouth every 8 (eight) hours. For 10 more days 30 tablet 0  . vitamin B-12 (CYANOCOBALAMIN) 1000 MCG tablet Take 1,000 mcg by mouth daily.    . Vitamin D, Ergocalciferol, (DRISDOL) 50000 UNITS CAPS capsule Take 50,000 Units by mouth every 7 (seven) days. Sunday    . oxyCODONE (OXY IR/ROXICODONE) 5 MG immediate release tablet Take 1-2 tablets (5-10 mg total) by mouth every  4 (four) hours as needed for moderate pain. (Patient not taking: Reported on 05/22/2015) 30 tablet 0   No current facility-administered medications on file prior to visit.   History reviewed. No pertinent family history. History   Social History  . Marital Status: Divorced    Spouse Name: N/A  . Number of Children: N/A  . Years of Education: N/A   Occupational History  . Not on file.   Social History Main Topics  . Smoking status: Current Every Day Smoker -- 0.25 packs/day for 45 years    Types: Cigarettes  . Smokeless tobacco: Never Used     Comment: "allergic to nicotine patch"  .  Alcohol Use: 7.2 oz/week    12 Cans of beer per week     Comment: 05/22/15-no ETOF since hospitalization 05/14/2015 "II'm an alcoholic; I may drink 1, 12 pack/wk; sometimes more when I want it; might drink a 6 pack of wine coolers but I don't drink beer w/that; ; get shaky after a couple days if I don't drink"  . Drug Use: No     Comment: 05/14/2015 "been clean for 20 years probably"  . Sexual Activity: Not Currently   Other Topics Concern  . Not on file   Social History Narrative    Review of Systems: Constitutional: Negative for fever, chills, diaphoresis, activity change, appetite change and fatigue. HENT: Negative for ear pain, nosebleeds, congestion, facial swelling, rhinorrhea, neck pain, neck stiffness and ear discharge.  Eyes: Negative for pain, discharge, redness, itching and visual disturbance. Respiratory: Negative for cough, choking, chest tightness, shortness of breath, wheezing and stridor.  Cardiovascular: Negative for chest pain, palpitations and leg swelling. Gastrointestinal: Negative for abdominal distention. Genitourinary: Negative for dysuria, urgency, frequency, hematuria, flank pain, decreased urine volume, difficulty urinating and dyspareunia.  Musculoskeletal: Negative for back pain, joint swelling, arthralgia and gait problem. Neurological: Negative for dizziness, tremors, seizures, syncope, facial asymmetry, speech difficulty, weakness, light-headedness, numbness and headaches.  Hematological: Negative for adenopathy. Does not bruise/bleed easily. Skin: Negative for rash, ulcer. Psychiatric/Behavioral: Negative for hallucinations, behavioral problems, confusion, dysphoric mood, decreased concentration and agitation.   CBC Latest Ref Rng 05/16/2015 05/15/2015 05/15/2015  WBC 4.0 - 10.5 K/uL 5.4 7.3 7.9  Hemoglobin 12.0 - 15.0 g/dL 11.9(L) 11.9(L) 12.4  Hematocrit 36.0 - 46.0 % 36.8 36.3 38.2  Platelets 150 - 400 K/uL 242 255 282      Objective:   Filed Vitals:     05/22/15 0937  BP: 154/92  Pulse: 67  Temp: 98.4 F (36.9 C)    Physical Exam: Constitutional: Patient appears well-developed and well-nourished. No distress. HENT: Normocephalic, atraumatic, External right and left ear normal. Oropharynx is clear and moist.  Eyes: Conjunctivae and EOM are normal. PERRLA, no scleral icterus. Neck: Normal ROM, No JVD. No tracheal deviation. No thyromegaly. CVS: RRR, S1/S2 +, no murmurs, no gallops, no carotid bruit.  Pulmonary: Effort and breath sounds normal, no stridor, rhonchi, wheezes, rales.  Abdominal: Soft. BS +, no distension, tenderness, rebound or guarding.  Musculoskeletal: Normal range of motion. No edema and no tenderness.  Lymphadenopathy: No lymphadenopathy noted, cervical, inguinal or axillary Neuro: Alert. Normal reflexes, muscle tone coordination. No cranial nerve deficit. Skin: Skin is warm and dry. No rash noted. Not diaphoretic. No erythema. No pallor. Psychiatric: Normal mood and affect. Behavior, judgment, thought content normal.  Lab Results  Component Value Date   WBC 5.4 05/16/2015   HGB 11.9* 05/16/2015   HCT 36.8 05/16/2015   MCV 92.7 05/16/2015  PLT 242 05/16/2015   Lab Results  Component Value Date   CREATININE 0.75 05/18/2015   BUN <5* 05/18/2015   NA 142 05/18/2015   K 3.5 05/18/2015   CL 116* 05/18/2015   CO2 23 05/18/2015    No results found for: HGBA1C Lipid Panel  No results found for: CHOL, TRIG, HDL, CHOLHDL, VLDL, LDLCALC  CLINICAL DATA: Left lower quadrant pain, nausea, diarrhea  EXAM: CT ABDOMEN AND PELVIS WITH CONTRAST  TECHNIQUE: Multidetector CT imaging of the abdomen and pelvis was performed using the standard protocol following bolus administration of intravenous contrast.  CONTRAST: OMNIPAQUE IOHEXOL 300 MG/ML SOLN  COMPARISON: None.  FINDINGS: Lower chest: Lung bases are clear.  Hepatobiliary: Liver is within normal limits. No suspicious/enhancing hepatic  lesions.  Layering gallstone (series 2/ image 33), without associated inflammatory changes.  Pancreas: Within normal limits.  Spleen: Within normal limits.  Adrenals/Urinary Tract: Adrenal glands are within normal limits.  6 mm cyst in the anterior interpolar right kidney (series 7/ image 17). Left kidney is within normal limits. No hydronephrosis.  Bladder is within normal limits.  Stomach/Bowel: Stomach is within normal limits.  No evidence of bowel obstruction.  Normal appendix.  Colonic diverticulosis with inflammatory changes in the left lower quadrant (series 2/ image 62), suggesting proximal sigmoid diverticulitis.  Trace pericolonic fluid/stranding. No drainable fluid collection/ abscess. No free air.  Vascular/Lymphatic: Atherosclerotic calcifications of the abdominal aorta and branch vessels.  No suspicious abdominopelvic lymphadenopathy.  Reproductive: Status post hysterectomy.  No adnexal masses.  Other: Trace pelvic ascites.  Musculoskeletal: Degenerative changes of the visualized thoracolumbar spine, most prominent at L5-S1.  IMPRESSION: Sigmoid diverticulitis. No evidence of perforation.  Trace pelvic ascites.   Electronically Signed  By: Charline Bills M.D.  On: 05/14/2015 18:10       Assessment and plan:  59 year old female who recently relocated from [place] with a history of hypertension, migraines, COPD, depression recently hospitalized for acute diverticulitis and has completed a course of antibiotics.  Acute diverticulitis: Symptoms are still persistent. Dietary modifications advised. If pain persist she would need a referral to GI for colonoscopy.  Hypertension: Above goal of less than 140/90 Meds refills.  COPD: Stable, no acute exacerbation. Continue medications.  Depression: Currently on antidepressants. Placed on Behavioral Health schedule for therapy and also for help with community  resources to help improve her living conditions.  GERD:  Continue meds.  The patient was given clear instructions to go to ER or return to medical center if symptoms don't improve, worsen or new problems develop. The patient verbalized understanding. The patient was told to call to get lab results if they haven't heard anything in the next week.    This note has been created with Education officer, environmental. Any transcriptional errors are unintentional.    Jaclyn Shaggy, MD. [facility] [phone] 05/22/2015, 2:05 PM

## 2015-05-28 ENCOUNTER — Telehealth: Payer: Self-pay | Admitting: Clinical

## 2015-05-28 NOTE — Telephone Encounter (Signed)
Pt wants to set up appointment w [facility], will come in on 06-05-15 at 11am.

## 2015-06-05 ENCOUNTER — Ambulatory Visit (HOSPITAL_BASED_OUTPATIENT_CLINIC_OR_DEPARTMENT_OTHER): Payer: Self-pay | Admitting: Clinical

## 2015-06-05 ENCOUNTER — Encounter: Payer: Self-pay | Admitting: Internal Medicine

## 2015-06-05 ENCOUNTER — Ambulatory Visit: Payer: Self-pay | Attending: Internal Medicine | Admitting: Internal Medicine

## 2015-06-05 VITALS — BP 143/83 | HR 69 | Temp 98.2°F | Resp 15 | Ht 64.0 in | Wt 148.2 lb

## 2015-06-05 DIAGNOSIS — F32A Depression, unspecified: Secondary | ICD-10-CM

## 2015-06-05 DIAGNOSIS — M79662 Pain in left lower leg: Secondary | ICD-10-CM | POA: Insufficient documentation

## 2015-06-05 DIAGNOSIS — K5792 Diverticulitis of intestine, part unspecified, without perforation or abscess without bleeding: Secondary | ICD-10-CM | POA: Insufficient documentation

## 2015-06-05 DIAGNOSIS — F419 Anxiety disorder, unspecified: Secondary | ICD-10-CM | POA: Insufficient documentation

## 2015-06-05 DIAGNOSIS — F329 Major depressive disorder, single episode, unspecified: Secondary | ICD-10-CM

## 2015-06-05 DIAGNOSIS — F17210 Nicotine dependence, cigarettes, uncomplicated: Secondary | ICD-10-CM | POA: Insufficient documentation

## 2015-06-05 DIAGNOSIS — Z79899 Other long term (current) drug therapy: Secondary | ICD-10-CM | POA: Insufficient documentation

## 2015-06-05 DIAGNOSIS — I10 Essential (primary) hypertension: Secondary | ICD-10-CM | POA: Insufficient documentation

## 2015-06-05 DIAGNOSIS — E780 Pure hypercholesterolemia: Secondary | ICD-10-CM | POA: Insufficient documentation

## 2015-06-05 DIAGNOSIS — K219 Gastro-esophageal reflux disease without esophagitis: Secondary | ICD-10-CM | POA: Insufficient documentation

## 2015-06-05 DIAGNOSIS — J449 Chronic obstructive pulmonary disease, unspecified: Secondary | ICD-10-CM | POA: Insufficient documentation

## 2015-06-05 MED ORDER — PANTOPRAZOLE SODIUM 40 MG PO TBEC: 40.0000 mg | DELAYED_RELEASE_TABLET | Freq: Every day | ORAL | Status: DC

## 2015-06-05 NOTE — Progress Notes (Signed)
Patient ID: Lisa Pham, female   DOB: [birth year], 59 y.o.   MRN: [id number]  CC: HFU  HPI: Lisa Pham is a 59 y.o. female here today for a follow up visit.  Patient has past medical history of   Lisa Pham is a 59 y.o. female who recently relocated from [place] a few weeks ago to [facility] and is here today to establish medical care. Medical history is notable for hypertension, COPD, GERD, depression, migraines. She was admitted to [facility] from 05/14/15-05/18/15 after she had presented via the ED with left lower abdominal pain and CT findings revealed acute diverticulitis.  Today Patient reports that she has been having some LLQ pain since hospital discharge. She reports that pain is rated as a 7/10 on pain scale. She denies blood in stools, nausea, vomiting. She has been attempting to stay away from seedy foods that may cause a flare.  Patient is concerned about more pronounced varicose veins in her left leg. She states that she has been experiencing left calf pain since hospital discharge described as a sharp pain. This pain is constant. She denies SOB, chest pain, or calf swelling.   Allergies  Allergen Reactions  . Latex Hives and Itching  . Penicillins Other (See Comments)    Yeast infection  . Nicotine Rash    The adhesive in nicotine patch causes her to break out.   Past Medical History  Diagnosis Date  . Hypertension   . COPD (chronic obstructive pulmonary disease)   . High cholesterol   . Heart murmur   . Myocardial infarction ~ 2011    "mild"  . Pneumonia ~ 2013  . B12 deficiency anemia   . GERD (gastroesophageal reflux disease)   . History of stomach ulcers   . Migraine     "3-4 times/wk or more" (05/14/2015)  . Chronic lower back pain   . Arthritis     "all over" (05/14/2015)  . Anxiety   . Depression   . Alcohol withdrawal    Current Outpatient Prescriptions on File Prior to Visit  Medication Sig Dispense Refill  . albuterol (PROVENTIL  HFA;VENTOLIN HFA) 108 (90 BASE) MCG/ACT inhaler Inhale 1-2 puffs into the lungs every 6 (six) hours as needed for wheezing or shortness of breath.    Marland Kitchen amLODipine (NORVASC) 10 MG tablet Take 1 tablet (10 mg total) by mouth daily. 30 tablet 2  . aspirin-acetaminophen-caffeine (EXCEDRIN MIGRAINE) 250-250-65 MG per tablet Take 2 tablets by mouth every 6 (six) hours as needed for migraine.    Marland Kitchen atorvastatin (LIPITOR) 40 MG tablet Take 1 tablet (40 mg total) by mouth daily. 30 tablet 2  . fluticasone (FLONASE) 50 MCG/ACT nasal spray Place 1 spray into both nostrils daily. 16 g 1  . gabapentin (NEURONTIN) 600 MG tablet Take 1 tablet (600 mg total) by mouth 3 (three) times daily. 90 tablet 2  . losartan (COZAAR) 100 MG tablet Take 1 tablet (100 mg total) by mouth daily. 30 tablet 2  . montelukast (SINGULAIR) 10 MG tablet Take 1 tablet (10 mg total) by mouth at bedtime. 30 tablet 2  . tiotropium (SPIRIVA) 18 MCG inhalation capsule Place 1 capsule (18 mcg total) into inhaler and inhale daily. 30 capsule 2  . topiramate (TOPAMAX) 50 MG tablet Take 1 tablet (50 mg total) by mouth 2 (two) times daily. 60 tablet 2  . traZODone (DESYREL) 100 MG tablet Take 1 tablet (100 mg total) by mouth at bedtime as needed for sleep. 30 tablet  2  . venlafaxine (EFFEXOR) 25 MG tablet Take 1 tablet (25 mg total) by mouth 2 (two) times daily. 60 tablet 2  . ciprofloxacin (CIPRO) 500 MG tablet Take 1 tablet (500 mg total) by mouth 2 (two) times daily. For 10 more days (Patient not taking: Reported on 06/05/2015) 20 tablet 0  . metroNIDAZOLE (FLAGYL) 500 MG tablet Take 1 tablet (500 mg total) by mouth every 8 (eight) hours. For 10 more days (Patient not taking: Reported on 06/05/2015) 30 tablet 0  . oxyCODONE (OXY IR/ROXICODONE) 5 MG immediate release tablet Take 1-2 tablets (5-10 mg total) by mouth every 4 (four) hours as needed for moderate pain. (Patient not taking: Reported on 05/22/2015) 30 tablet 0  . vitamin B-12 (CYANOCOBALAMIN)  1000 MCG tablet Take 1,000 mcg by mouth daily.    . Vitamin D, Ergocalciferol, (DRISDOL) 50000 UNITS CAPS capsule Take 50,000 Units by mouth every 7 (seven) days. Sunday     No current facility-administered medications on file prior to visit.   History reviewed. No pertinent family history. Social History   Social History  . Marital Status: Divorced    Spouse Name: N/A  . Number of Children: N/A  . Years of Education: N/A   Occupational History  . Not on file.   Social History Main Topics  . Smoking status: Current Every Day Smoker -- 0.25 packs/day for 45 years    Types: Cigarettes  . Smokeless tobacco: Never Used     Comment: "allergic to nicotine patch"  . Alcohol Use: 7.2 oz/week    12 Cans of beer per week     Comment: 05/22/15-no ETOF since hospitalization 05/14/2015 "II'm an alcoholic; I may drink 1, 12 pack/wk; sometimes more when I want it; might drink a 6 pack of wine coolers but I don't drink beer w/that; ; get shaky after a couple days if I don't drink"  . Drug Use: No     Comment: 05/14/2015 "been clean for 20 years probably"  . Sexual Activity: Not Currently   Other Topics Concern  . Not on file   Social History Narrative    Review of Systems: Other than what is stated in HPI, all other systems are negative.   Objective:   Filed Vitals:   06/05/15 1016  BP: 143/83  Pulse: 69  Temp: 98.2 F (36.8 C)  Resp: 15    Physical Exam  Constitutional: She is oriented to person, place, and time. No distress.  Cardiovascular: Normal rate, regular rhythm, normal heart sounds and intact distal pulses.   Pulmonary/Chest: Effort normal and breath sounds normal.  Musculoskeletal:  Calf circumference equal bilaterally + Homan's sign on left leg  Neurological: She is alert and oriented to person, place, and time.  Skin:  Varicose veins BLE     Lab Results  Component Value Date   WBC 5.4 05/16/2015   HGB 11.9* 05/16/2015   HCT 36.8 05/16/2015   MCV 92.7  05/16/2015   PLT 242 05/16/2015   Lab Results  Component Value Date   CREATININE 0.75 05/18/2015   BUN <5* 05/18/2015   NA 142 05/18/2015   K 3.5 05/18/2015   CL 116* 05/18/2015   CO2 23 05/18/2015    No results found for: HGBA1C Lipid Panel  No results found for: CHOL, TRIG, HDL, CHOLHDL, VLDL, LDLCALC     Assessment and plan:   Lyndsie was seen today for follow-up.  Diagnoses and all orders for this visit:  Calf pain, left -  VAS [place] LOWER EXTREMITY VENOUS (DVT); Future Patient given signs and symptoms that should warrant immediate attention at ER. Will r/o DVT. Explained dangers of blood clot and stressed the need to keep doppler appointment.  Diverticulitis of intestine without perforation or abscess without bleeding -     Ambulatory referral to Gastroenterology Patient reports that she had a colonoscopy a few months ago, I will request records from her last provider. Patient refuses to repeat procedure.  Gastroesophageal reflux disease, esophagitis presence not specified -   Begin pantoprazole (PROTONIX) 40 MG tablet; Take 1 tablet (40 mg total) by mouth daily.  Return in about 3 months (around 09/05/2015) for Hypertension.       Ambrose Finland, NP-C [facility] [phone] 06/05/2015, 10:22 AM

## 2015-06-05 NOTE — Progress Notes (Signed)
ASSESSMENT: Pt currently experiencing symptoms of depression primarily, and some anxiety. Pt needs to f/u with PCP and [facility]; would benefit from community resources, as well as supportive counseling regarding coping with symptoms of  Stage of Change: precontemplative  PLAN: 1. F/U with behavioral health consultant in as needed 2. Psychiatric Medications: Neurontin, Trazodone. 3. Behavioral recommendation(s):   -Find time to relax daily -Go to [employer] for housing application -Consider [employer] for social support groups -Apply for [employer] transportation, if needed -If Medicaid is denied, make an appointment with [facility] financial counseling  SUBJECTIVE: Pt. referred by [facility] for symptoms of anxiety and depression:  Pt. reports the following symptoms/concerns: Pt says that she is overwhelmed being in a house with her children and grandchildren; she wants peace and quiet and her own place to live. She is on disability in [place] and is having it transferred to [facility]. She currently has "a couple" of drinks to cope with the stress, and to sleep.  Duration of problem: about 2 months Severity: moderate  OBJECTIVE: Orientation & Cognition: Oriented x3. Thought processes normal and appropriate to situation. Mood: appropriate. Affect: appropriate Appearance: appropriate Risk of harm to self or others: no risk of harm to self or others Substance use: alcohol Assessments administered: PHQ9: 21/ GAD7: 16   Diagnosis: Depression CPT Code: F32.9 -------------------------------------------- Other(s) present in the room: granddaughter   Time spent with patient in exam room: 30 minutes

## 2015-06-05 NOTE — Patient Instructions (Signed)
The best way to take Pantoprazole--for acid reflux is early in the morning at least 30 minutes before meals  I am sencding you to the hospital for a study of your left leg to make sure you have not developed a blood clot

## 2015-06-05 NOTE — Progress Notes (Signed)
Patient states here for follow up from the hospital Patient was admitted with diverticulitis Patient states she does not need any refills at this time

## 2015-06-06 ENCOUNTER — Ambulatory Visit (HOSPITAL_COMMUNITY)
Admission: RE | Admit: 2015-06-06 | Discharge: 2015-06-06 | Disposition: A | Discharge status: Home / Self Care | Payer: Medicaid Other | Source: Ambulatory Visit | Attending: Internal Medicine | Admitting: Internal Medicine

## 2015-06-06 DIAGNOSIS — M7989 Other specified soft tissue disorders: Secondary | ICD-10-CM | POA: Diagnosis not present

## 2015-06-06 DIAGNOSIS — M79662 Pain in left lower leg: Secondary | ICD-10-CM | POA: Diagnosis not present

## 2015-06-06 NOTE — Progress Notes (Addendum)
Preliminary results by tech - Negative for deep and superficial vein thrombosis in both lower extremities. Attempted to call office twice. Marilynne Halsted, BS, RDMS, RVT

## 2015-06-20 ENCOUNTER — Telehealth: Payer: Self-pay

## 2015-06-20 NOTE — Telephone Encounter (Signed)
-----   Message from Ambrose Finland, NP sent at 06/09/2015  8:23 PM EDT ----- Scan was normal. No blood clots!!

## 2015-06-20 NOTE — Telephone Encounter (Signed)
Nurse called patient, reached voicemail. Left message for patient to call Sylvan Sookdeo with [facility], at [phone].  

## 2015-06-25 ENCOUNTER — Telehealth: Payer: Self-pay

## 2015-06-25 NOTE — Telephone Encounter (Signed)
-----   Message from Ambrose Finland, NP sent at 06/25/2015  8:18 AM EDT ----- [facility] has been trying to call her to set up appointment for her diverticulitis. She needs to call them at [phone] if she still desires to seek care

## 2015-06-25 NOTE — Telephone Encounter (Signed)
Attempted to notify patient that [facility] has been trying to contact her to set Up an [phone] Patient not available Left message on voice mail for patient to return our call

## 2015-06-26 ENCOUNTER — Emergency Department (HOSPITAL_COMMUNITY): Payer: Medicaid - Out of State

## 2015-06-26 ENCOUNTER — Emergency Department (HOSPITAL_COMMUNITY)
Admission: EM | Admit: 2015-06-26 | Discharge: 2015-06-26 | Disposition: A | Discharge status: Home / Self Care | Payer: Medicaid - Out of State | Attending: Emergency Medicine | Admitting: Emergency Medicine

## 2015-06-26 ENCOUNTER — Encounter (HOSPITAL_COMMUNITY): Payer: Self-pay | Admitting: *Deleted

## 2015-06-26 DIAGNOSIS — Z720 Tobacco use: Secondary | ICD-10-CM | POA: Insufficient documentation

## 2015-06-26 DIAGNOSIS — M1990 Unspecified osteoarthritis, unspecified site: Secondary | ICD-10-CM | POA: Diagnosis not present

## 2015-06-26 DIAGNOSIS — R011 Cardiac murmur, unspecified: Secondary | ICD-10-CM | POA: Insufficient documentation

## 2015-06-26 DIAGNOSIS — D519 Vitamin B12 deficiency anemia, unspecified: Secondary | ICD-10-CM | POA: Diagnosis not present

## 2015-06-26 DIAGNOSIS — I10 Essential (primary) hypertension: Secondary | ICD-10-CM | POA: Insufficient documentation

## 2015-06-26 DIAGNOSIS — Z8701 Personal history of pneumonia (recurrent): Secondary | ICD-10-CM | POA: Insufficient documentation

## 2015-06-26 DIAGNOSIS — R112 Nausea with vomiting, unspecified: Secondary | ICD-10-CM | POA: Diagnosis not present

## 2015-06-26 DIAGNOSIS — R197 Diarrhea, unspecified: Secondary | ICD-10-CM | POA: Diagnosis not present

## 2015-06-26 DIAGNOSIS — Z7951 Long term (current) use of inhaled steroids: Secondary | ICD-10-CM | POA: Diagnosis not present

## 2015-06-26 DIAGNOSIS — R1010 Upper abdominal pain, unspecified: Secondary | ICD-10-CM

## 2015-06-26 DIAGNOSIS — J449 Chronic obstructive pulmonary disease, unspecified: Secondary | ICD-10-CM | POA: Diagnosis not present

## 2015-06-26 DIAGNOSIS — G43909 Migraine, unspecified, not intractable, without status migrainosus: Secondary | ICD-10-CM | POA: Diagnosis not present

## 2015-06-26 DIAGNOSIS — G8929 Other chronic pain: Secondary | ICD-10-CM | POA: Insufficient documentation

## 2015-06-26 DIAGNOSIS — K219 Gastro-esophageal reflux disease without esophagitis: Secondary | ICD-10-CM | POA: Diagnosis not present

## 2015-06-26 DIAGNOSIS — F419 Anxiety disorder, unspecified: Secondary | ICD-10-CM | POA: Diagnosis not present

## 2015-06-26 DIAGNOSIS — R6883 Chills (without fever): Secondary | ICD-10-CM | POA: Insufficient documentation

## 2015-06-26 DIAGNOSIS — Z880 Allergy status to penicillin: Secondary | ICD-10-CM | POA: Insufficient documentation

## 2015-06-26 DIAGNOSIS — R1013 Epigastric pain: Secondary | ICD-10-CM | POA: Insufficient documentation

## 2015-06-26 DIAGNOSIS — E780 Pure hypercholesterolemia: Secondary | ICD-10-CM | POA: Insufficient documentation

## 2015-06-26 DIAGNOSIS — Z79899 Other long term (current) drug therapy: Secondary | ICD-10-CM | POA: Insufficient documentation

## 2015-06-26 DIAGNOSIS — Z91040 Latex allergy status: Secondary | ICD-10-CM | POA: Diagnosis not present

## 2015-06-26 DIAGNOSIS — R109 Unspecified abdominal pain: Secondary | ICD-10-CM | POA: Diagnosis present

## 2015-06-26 DIAGNOSIS — I252 Old myocardial infarction: Secondary | ICD-10-CM | POA: Insufficient documentation

## 2015-06-26 DIAGNOSIS — F329 Major depressive disorder, single episode, unspecified: Secondary | ICD-10-CM | POA: Insufficient documentation

## 2015-06-26 LAB — CBC
HEMATOCRIT: 42.7 % (ref 36.0–46.0)
HEMOGLOBIN: 14.2 g/dL (ref 12.0–15.0)
MCH: 29.6 pg (ref 26.0–34.0)
MCHC: 33.3 g/dL (ref 30.0–36.0)
MCV: 89 fL (ref 78.0–100.0)
Platelets: 236 10*3/uL (ref 150–400)
RBC: 4.8 MIL/uL (ref 3.87–5.11)
RDW: 12.6 % (ref 11.5–15.5)
WBC: 7.4 10*3/uL (ref 4.0–10.5)

## 2015-06-26 LAB — COMPREHENSIVE METABOLIC PANEL
ALBUMIN: 4.2 g/dL (ref 3.5–5.0)
ALT: 37 U/L (ref 14–54)
ANION GAP: 12 (ref 5–15)
AST: 60 U/L — AB (ref 15–41)
Alkaline Phosphatase: 71 U/L (ref 38–126)
BILIRUBIN TOTAL: 1.6 mg/dL — AB (ref 0.3–1.2)
BUN: 5 mg/dL — ABNORMAL LOW (ref 6–20)
CHLORIDE: 107 mmol/L (ref 101–111)
CO2: 21 mmol/L — ABNORMAL LOW (ref 22–32)
Calcium: 9.2 mg/dL (ref 8.9–10.3)
Creatinine, Ser: 0.78 mg/dL (ref 0.44–1.00)
GFR calc Af Amer: >60 mL/min (ref >60)
Glucose, Bld: 105 mg/dL — ABNORMAL HIGH (ref 65–99)
POTASSIUM: 3.4 mmol/L — AB (ref 3.5–5.1)
Sodium: 140 mmol/L (ref 135–145)
Total Protein: 6.6 g/dL (ref 6.5–8.1)

## 2015-06-26 LAB — TYPE AND SCREEN
ABO/RH(D): O POS
ANTIBODY SCREEN: NEGATIVE

## 2015-06-26 LAB — ABO/RH: ABO/RH(D): O POS

## 2015-06-26 LAB — POC OCCULT BLOOD, ED: Fecal Occult Bld: NEGATIVE

## 2015-06-26 LAB — LIPASE, BLOOD: LIPASE: 23 U/L (ref 22–51)

## 2015-06-26 MED ORDER — RANITIDINE HCL 150 MG PO CAPS: 150.0000 mg | ORAL_CAPSULE | Freq: Two times a day (BID) | ORAL | Status: DC

## 2015-06-26 MED ORDER — ONDANSETRON HCL 4 MG/2ML IJ SOLN
4.0000 mg | Freq: Once | INTRAMUSCULAR | Status: AC
  Administered 2015-06-26: 4 mg via INTRAVENOUS
  Filled 2015-06-26: qty 2

## 2015-06-26 MED ORDER — SODIUM CHLORIDE 0.9 % IV BOLUS (SEPSIS)
500.0000 mL | Freq: Once | INTRAVENOUS | Status: AC
  Administered 2015-06-26: 500 mL via INTRAVENOUS

## 2015-06-26 MED ORDER — PROMETHAZINE HCL 25 MG PO TABS: 25.0000 mg | ORAL_TABLET | Freq: Four times a day (QID) | ORAL | Status: DC | PRN

## 2015-06-26 MED ORDER — FENTANYL CITRATE (PF) 100 MCG/2ML IJ SOLN
50.0000 ug | Freq: Once | INTRAMUSCULAR | Status: AC
  Administered 2015-06-26: 50 ug via INTRAVENOUS
  Filled 2015-06-26: qty 2

## 2015-06-26 NOTE — ED Provider Notes (Signed)
CSN: [id number]     Arrival date & time 06/26/15  1409 History   First MD Initiated Contact with Patient 06/26/15 1651     Chief Complaint  Patient presents with  . Abdominal Pain     (Consider location/radiation/quality/duration/timing/severity/associated sxs/prior Treatment) Patient is a 59 y.o. female presenting with abdominal pain. The history is provided by the patient.  Abdominal Pain Associated symptoms: chills, diarrhea, fatigue, nausea and vomiting   Associated symptoms: no chest pain and no shortness of breath    patient presents with abdominal pain nausea vomiting diarrhea and blood in the stool. She states around a month ago she was treated for diverticulitis. States the pain in her abdomen has gotten better but the pain flank did not get better. She been on antibiotics. States she couldn't take the pain medicines. States over the last few days she's had nausea vomiting diarrhea and everything she eats comes up or comes out. States she has had some fevers. States she just feels bad all over. No other bleeding. States she has had watery mucousy stool. Has had some small amounts of blood in the stool. States the pain hurts in her upper abdomen.  Past Medical History  Diagnosis Date  . Hypertension   . COPD (chronic obstructive pulmonary disease)   . High cholesterol   . Heart murmur   . Myocardial infarction ~ 2011    "mild"  . Pneumonia ~ 2013  . B12 deficiency anemia   . GERD (gastroesophageal reflux disease)   . History of stomach ulcers   . Migraine     "3-4 times/wk or more" (05/14/2015)  . Chronic lower back pain   . Arthritis     "all over" (05/14/2015)  . Anxiety   . Depression   . Alcohol withdrawal    Past Surgical History  Procedure Laterality Date  . Tibia fracture surgery Left ~ 2000  . Abdominal hysterectomy    . Excisional hemorrhoidectomy  ~ 1970  . Thumb fusion Bilateral ~ 2010    "took something from wrist/forearm & put it in my thumb"  . Tubal  ligation    . Fracture surgery     No family history on file. Social History  Substance Use Topics  . Smoking status: Current Every Day Smoker -- 0.25 packs/day for 45 years    Types: Cigarettes  . Smokeless tobacco: Never Used     Comment: "allergic to nicotine patch"  . Alcohol Use: 7.2 oz/week    12 Cans of beer per week     Comment: 05/22/15-no ETOF since hospitalization 05/14/2015 "II'm an alcoholic; I may drink 1, 12 pack/wk; sometimes more when I want it; might drink a 6 pack of wine coolers but I don't drink beer w/that; ; get shaky after a couple days if I don't drink"   OB History    No data available     Review of Systems  Constitutional: Positive for chills and fatigue. Negative for activity change and appetite change.  Eyes: Negative for pain.  Respiratory: Negative for chest tightness and shortness of breath.   Cardiovascular: Negative for chest pain and leg swelling.  Gastrointestinal: Positive for nausea, vomiting, abdominal pain and diarrhea.  Genitourinary: Negative for flank pain.  Musculoskeletal: Negative for back pain and neck stiffness.  Skin: Negative for rash.  Neurological: Negative for weakness, numbness and headaches.  Psychiatric/Behavioral: Negative for behavioral problems.      Allergies  Latex; Penicillins; and Nicotine  Home Medications   Prior  to Admission medications   Medication Sig Start Date End Date Taking? Authorizing Provider  albuterol (PROVENTIL HFA;VENTOLIN HFA) 108 (90 BASE) MCG/ACT inhaler Inhale 1-2 puffs into the lungs every 6 (six) hours as needed for wheezing or shortness of breath.    Historical Provider, MD  amLODipine (NORVASC) 10 MG tablet Take 1 tablet (10 mg total) by mouth daily. 05/22/15   Jaclyn Shaggy, MD  aspirin-acetaminophen-caffeine (EXCEDRIN MIGRAINE) [id number] MG per tablet Take 2 tablets by mouth every 6 (six) hours as needed for migraine.    Historical Provider, MD  atorvastatin (LIPITOR) 40 MG tablet Take 1  tablet (40 mg total) by mouth daily. 05/22/15   Jaclyn Shaggy, MD  ciprofloxacin (CIPRO) 500 MG tablet Take 1 tablet (500 mg total) by mouth 2 (two) times daily. For 10 more days Patient not taking: Reported on 06/05/2015 05/18/15   Osvaldo Shipper, MD  fluticasone [facility]) 50 MCG/ACT nasal spray Place 1 spray into both nostrils daily. 05/22/15   Jaclyn Shaggy, MD  gabapentin (NEURONTIN) 600 MG tablet Take 1 tablet (600 mg total) by mouth 3 (three) times daily. 05/22/15   Jaclyn Shaggy, MD  losartan (COZAAR) 100 MG tablet Take 1 tablet (100 mg total) by mouth daily. 05/22/15   Jaclyn Shaggy, MD  metroNIDAZOLE (FLAGYL) 500 MG tablet Take 1 tablet (500 mg total) by mouth every 8 (eight) hours. For 10 more days Patient not taking: Reported on 06/05/2015 05/18/15   Osvaldo Shipper, MD  montelukast (SINGULAIR) 10 MG tablet Take 1 tablet (10 mg total) by mouth at bedtime. 05/22/15   Jaclyn Shaggy, MD  oxyCODONE (OXY IR/ROXICODONE) 5 MG immediate release tablet Take 1-2 tablets (5-10 mg total) by mouth every 4 (four) hours as needed for moderate pain. Patient not taking: Reported on 05/22/2015 05/18/15   Osvaldo Shipper, MD  pantoprazole (PROTONIX) 40 MG tablet Take 1 tablet (40 mg total) by mouth daily. 06/05/15   Ambrose Finland, NP  promethazine (PHENERGAN) 25 MG tablet Take 1 tablet (25 mg total) by mouth every 6 (six) hours as needed for nausea. 06/26/15   Benjiman Core, MD  ranitidine (ZANTAC) 150 MG capsule Take 1 capsule (150 mg total) by mouth 2 (two) times daily. 06/26/15   Benjiman Core, MD  tiotropium (SPIRIVA) 18 MCG inhalation capsule Place 1 capsule (18 mcg total) into inhaler and inhale daily. 05/22/15   Jaclyn Shaggy, MD  topiramate (TOPAMAX) 50 MG tablet Take 1 tablet (50 mg total) by mouth 2 (two) times daily. 05/22/15   Jaclyn Shaggy, MD  traZODone (DESYREL) 100 MG tablet Take 1 tablet (100 mg total) by mouth at bedtime as needed for sleep. 05/22/15   Jaclyn Shaggy, MD  venlafaxine (EFFEXOR) 25 MG tablet Take 1 tablet  (25 mg total) by mouth 2 (two) times daily. 05/22/15   Jaclyn Shaggy, MD  vitamin B-12 (CYANOCOBALAMIN) 1000 MCG tablet Take 1,000 mcg by mouth daily.    Historical Provider, MD   BP 162/79 mmHg  Pulse 61  Temp(Src) 98.7 F (37.1 C) (Oral)  Resp 18  Ht 5\' 4"  (1.626 m)  Wt 145 lb (65.772 kg)  BMI 24.88 kg/m2  SpO2 99% Physical Exam  Constitutional: She is oriented to person, place, and time. She appears well-developed and well-nourished.  HENT:  Head: Normocephalic and atraumatic.  Neck: Normal range of motion.  Cardiovascular: Normal rate, regular rhythm and normal heart sounds.   No murmur heard. Pulmonary/Chest: Effort normal and breath sounds normal. No respiratory distress. She has no wheezes. She  has no rales.  Abdominal: Soft. She exhibits no distension. There is tenderness. There is no rebound and no guarding.  No left lower quadrant tenderness. Epigastric tenderness without rebound or guarding.  Musculoskeletal: Normal range of motion.  Neurological: She is alert and oriented to person, place, and time. No cranial nerve deficit.  Skin: Skin is warm and dry.  Psychiatric: She has a normal mood and affect. Her speech is normal.  Nursing note and vitals reviewed.   ED Course  Procedures (including critical care time) Labs Review Labs Reviewed  COMPREHENSIVE METABOLIC PANEL - Abnormal; Notable for the following:    Potassium 3.4 (*)    CO2 21 (*)    Glucose, Bld 105 (*)    BUN 5 (*)    AST 60 (*)    Total Bilirubin 1.6 (*)    All other components within normal limits  LIPASE, BLOOD  CBC  URINALYSIS, ROUTINE W REFLEX MICROSCOPIC (NOT AT [facility])  POC OCCULT BLOOD, ED  TYPE AND SCREEN  ABO/RH    Imaging Review US Abdomen Complete  06/26/2015   CLINICAL DATA:  Abdominal pain and nausea  EXAM: ULTRASOUND ABDOMEN COMPLETE  COMPARISON:  CT abdomen/ pelvis 05/14/2015  FINDINGS: Gallbladder: No gallstones or wall thickening visualized. No sonographic Murphy sign noted.  Common  bile duct: Diameter: 6 mm  Liver: No focal lesion identified. Within normal limits in parenchymal echogenicity.  IVC: No abnormality visualized.  Pancreas: Visualized portion unremarkable.  Spleen: Size and appearance within normal limits.  Right Kidney: Length: 10.0 cm. Echogenicity within normal limits. No mass or hydronephrosis visualized.  Left Kidney: Length: 10.6 cm. Echogenicity within normal limits. No mass or hydronephrosis visualized.  Abdominal aorta: No aneurysm visualized.  Other findings: None.  IMPRESSION: No acute intra-abdominal abnormality.   Electronically Signed   By: Christiana Pellant M.D.   On: 06/26/2015 22:17   I have personally reviewed and evaluated these images and lab results as part of my medical decision-making.   EKG Interpretation None      MDM   Final diagnoses:  Nausea vomiting and diarrhea  Pain of upper abdomen    Patient with abdominal pain. Recent CT showed left-sided diverticulitis. No more tenderness in the spot. Epigastric pain. States she's been taking some naproxen and I wonder if this did cause some gastritis. Ultrasound done to previous gallstones on CT. Ultrasound did not show gallstones or other abnormality. Will discharge home.    Benjiman Core, MD 06/26/15 2249

## 2015-06-26 NOTE — ED Notes (Signed)
Pt reports abdominal pain with N/V/D and blood in stool. Pt has hx of diverticulitis. Pt states that she has had decreased appetite as well.

## 2015-06-26 NOTE — ED Notes (Signed)
Pt ambulated to the bathroom to give urine sample, pt unable to go at this time.

## 2015-06-26 NOTE — ED Notes (Signed)
Pt requesting pain medications for abd pain, Dr. Rubin Payor made aware and awaiting orders.

## 2015-06-26 NOTE — Discharge Instructions (Signed)

## 2015-06-26 NOTE — ED Notes (Signed)
Pt verbalized understanding of d/c instructions and has no further questions. Pt stable and NAD.  

## 2015-06-26 NOTE — ED Notes (Signed)
Dr Rubin Payor in room with female RN as chaperone.

## 2015-06-26 NOTE — ED Notes (Signed)
Patient unable to provide a specimen at this time; will attempt later

## 2015-06-26 NOTE — ED Notes (Signed)
Patient up ambulatory to the bathroom at this time; patient aware of need of urine specimen

## 2015-06-26 NOTE — ED Notes (Signed)
Pt given cranberry juice and graham crackers for PO challenge.

## 2015-06-26 NOTE — ED Notes (Signed)
Pt states "I am still unable to urinate at this time"

## 2015-07-09 ENCOUNTER — Telehealth: Payer: Self-pay | Admitting: Internal Medicine

## 2015-07-09 ENCOUNTER — Telehealth: Payer: Self-pay

## 2015-07-09 MED ORDER — ALBUTEROL SULFATE HFA 108 (90 BASE) MCG/ACT IN AERS: 1.0000 | INHALATION_SPRAY | Freq: Four times a day (QID) | RESPIRATORY_TRACT | Status: DC | PRN

## 2015-07-09 NOTE — Telephone Encounter (Signed)
Patient called and would like for her medication to be sent to [facility] on [address]. Her medication is albuterol (PROVENTIL HFA;VENTOLIN HFA) 108 (90 BASE) MCG/ACT inhaler, and her Vitamin D

## 2015-07-09 NOTE — Telephone Encounter (Signed)
Returned patient phone call Patient not available Left message on voice mail to return our call Prescription sent to [facility] on [address]

## 2015-07-09 NOTE — Telephone Encounter (Signed)
Patient called for a med refill on albuterol (PROVENTIL HFA;VENTOLIN HFA) 108 (90 BASE) MCG/ACT inhaler, and her Vitamin D. Please f/u.

## 2015-07-16 ENCOUNTER — Telehealth: Payer: Self-pay | Admitting: Clinical

## 2015-07-16 NOTE — Telephone Encounter (Signed)
Pt is out of blood pressure meds, and has not picked up many other meds because her [place] Medicaid has not transferred to [facility], she is uninsured, and the cost at [facility] is too prohibitive. Pt informed that [facility] will call her with cost of meds through our pharmacy, as an uninsured pt, and she agrees.

## 2015-07-23 ENCOUNTER — Telehealth: Payer: Self-pay

## 2015-07-23 NOTE — Telephone Encounter (Signed)
Called patient Patient not available Person who answered the phone said she would return our call

## 2015-07-23 NOTE — Telephone Encounter (Signed)
-----   Message from Tandy Gaw, RN sent at 07/22/2015  2:04 PM EDT -----   ----- Message -----    From: Ambrose Finland, NP    Sent: 06/09/2015   8:23 PM      To: Tandy Gaw, RN  Scan was normal. No blood clots!!

## 2015-12-04 ENCOUNTER — Encounter (HOSPITAL_BASED_OUTPATIENT_CLINIC_OR_DEPARTMENT_OTHER): Payer: Medicaid Other | Admitting: Internal Medicine

## 2015-12-04 DIAGNOSIS — Z5321 Procedure and treatment not carried out due to patient leaving prior to being seen by health care provider: Secondary | ICD-10-CM

## 2015-12-11 ENCOUNTER — Ambulatory Visit: Payer: Medicaid - Out of State | Attending: Internal Medicine | Admitting: Internal Medicine

## 2015-12-11 ENCOUNTER — Ambulatory Visit (HOSPITAL_BASED_OUTPATIENT_CLINIC_OR_DEPARTMENT_OTHER): Payer: Medicaid - Out of State | Admitting: Clinical

## 2015-12-11 ENCOUNTER — Encounter: Payer: Self-pay | Admitting: Internal Medicine

## 2015-12-11 VITALS — BP 185/103 | HR 68 | Temp 98.0°F | Resp 16 | Ht 64.0 in | Wt 155.6 lb

## 2015-12-11 DIAGNOSIS — R51 Headache: Secondary | ICD-10-CM | POA: Diagnosis not present

## 2015-12-11 DIAGNOSIS — B372 Candidiasis of skin and nail: Secondary | ICD-10-CM

## 2015-12-11 DIAGNOSIS — M1990 Unspecified osteoarthritis, unspecified site: Secondary | ICD-10-CM | POA: Insufficient documentation

## 2015-12-11 DIAGNOSIS — E785 Hyperlipidemia, unspecified: Secondary | ICD-10-CM

## 2015-12-11 DIAGNOSIS — L309 Dermatitis, unspecified: Secondary | ICD-10-CM | POA: Insufficient documentation

## 2015-12-11 DIAGNOSIS — F32A Depression, unspecified: Secondary | ICD-10-CM

## 2015-12-11 DIAGNOSIS — I10 Essential (primary) hypertension: Secondary | ICD-10-CM | POA: Diagnosis present

## 2015-12-11 DIAGNOSIS — F418 Other specified anxiety disorders: Secondary | ICD-10-CM

## 2015-12-11 DIAGNOSIS — F329 Major depressive disorder, single episode, unspecified: Secondary | ICD-10-CM

## 2015-12-11 DIAGNOSIS — F419 Anxiety disorder, unspecified: Principal | ICD-10-CM

## 2015-12-11 DIAGNOSIS — IMO0001 Reserved for inherently not codable concepts without codable children: Secondary | ICD-10-CM

## 2015-12-11 DIAGNOSIS — Z79899 Other long term (current) drug therapy: Secondary | ICD-10-CM | POA: Diagnosis not present

## 2015-12-11 DIAGNOSIS — M8580 Other specified disorders of bone density and structure, unspecified site: Secondary | ICD-10-CM

## 2015-12-11 DIAGNOSIS — Z7982 Long term (current) use of aspirin: Secondary | ICD-10-CM | POA: Diagnosis not present

## 2015-12-11 DIAGNOSIS — J449 Chronic obstructive pulmonary disease, unspecified: Secondary | ICD-10-CM | POA: Insufficient documentation

## 2015-12-11 DIAGNOSIS — R030 Elevated blood-pressure reading, without diagnosis of hypertension: Secondary | ICD-10-CM

## 2015-12-11 LAB — LIPID PANEL
Cholesterol: 209 mg/dL — ABNORMAL HIGH (ref 125–200)
HDL: 78 mg/dL (ref >=46)
LDL CALC: 101 mg/dL (ref <130)
Total CHOL/HDL Ratio: 2.7 Ratio (ref <=5.0)
Triglycerides: 151 mg/dL — ABNORMAL HIGH (ref <150)
VLDL: 30 mg/dL (ref <30)

## 2015-12-11 LAB — BASIC METABOLIC PANEL
BUN: 9 mg/dL (ref 7–25)
CALCIUM: 9.6 mg/dL (ref 8.6–10.4)
CO2: 24 mmol/L (ref 20–31)
CREATININE: 0.86 mg/dL (ref 0.50–0.99)
Chloride: 108 mmol/L (ref 98–110)
Glucose, Bld: 106 mg/dL — ABNORMAL HIGH (ref 65–99)
Potassium: 3.5 mmol/L (ref 3.5–5.3)
SODIUM: 142 mmol/L (ref 135–146)

## 2015-12-11 LAB — T4, FREE: FREE T4: 0.9 ng/dL (ref 0.8–1.8)

## 2015-12-11 LAB — TSH: TSH: 0.31 m[IU]/L — AB

## 2015-12-11 MED ORDER — ATORVASTATIN CALCIUM 40 MG PO TABS: 40.0000 mg | ORAL_TABLET | Freq: Every day | ORAL | Status: DC

## 2015-12-11 MED ORDER — CLONIDINE HCL 0.1 MG PO TABS
0.1000 mg | ORAL_TABLET | Freq: Once | ORAL | Status: AC
  Administered 2015-12-11: 0.1 mg via ORAL

## 2015-12-11 MED ORDER — NYSTATIN 100000 UNIT/GM EX POWD: CUTANEOUS | Status: DC

## 2015-12-11 MED ORDER — AMLODIPINE BESYLATE 10 MG PO TABS: 10.0000 mg | ORAL_TABLET | Freq: Every day | ORAL | Status: DC

## 2015-12-11 MED ORDER — LOSARTAN POTASSIUM 100 MG PO TABS: 100.0000 mg | ORAL_TABLET | Freq: Every day | ORAL | Status: DC

## 2015-12-11 MED FILL — LOSARTAN POTASSIUM 100 MG T: 100 | 30 days supply | Qty: 30 | Fill #0

## 2015-12-11 MED FILL — AMLODIPINE BESYLATE 10 MG T: 10 | 30 days supply | Qty: 30 | Fill #0

## 2015-12-11 MED FILL — ATORVASTATIN 40 MG TABLET: 40 | 30 days supply | Qty: 30 | Fill #0

## 2015-12-11 MED FILL — NYSTOP 100,000 UNITS/GM PWD: 100000 | 30 days supply | Qty: 30 | Fill #0

## 2015-12-11 NOTE — Patient Instructions (Signed)
Get some OTC Calcium with vitamin D supplements to take daily.

## 2015-12-11 NOTE — Progress Notes (Signed)
ASSESSMENT: Pt currently experiencing symptoms of anxiety and depression. Pt needs to f/u with PCP and [facility]; would benefit from healthcare navigation, and may benefit from psychoeducation and Motivational Interviewing regarding coping with symptoms of anxiety and depression.  Stage of Change: precontemplative  PLAN: 1. F/U with behavioral health consultant in one month 2. Psychiatric Medications: Effexor, Desyrel 3. Behavioral recommendation(s):   -Consider going to [facility], as discussed in office -If Medicaid is not approved for [place], set up appointment with [facility] financial counselors -Consider reading educational material regarding coping with symptoms of anxiety and depression  SUBJECTIVE: Pt. referred by Holland Commons, NP for symptoms of anxiety and depression. Pt. reports the following symptoms/concerns: Pt states that her primary concern today is confusion over why she has not gotten Medicaid for [place], when she previously had Medicaid in [place], and states that she was [place] disabled out-of-state, and that she has gone to DSS in both [place] and [place]; has not heard back from case workers at either DSS. Also, expresses concern over food insecurity and dislike over new housing; has cut down on alcohol since moving into less stressful environment, and wants to find community health in [place], near new housing. Duration of problem: Over one month Severity: moderate  OBJECTIVE: Orientation & Cognition: Oriented x3. Thought processes normal and appropriate to situation. Mood: appropriate. Affect: appropriate Appearance: appropriate Risk of harm to self or others: no known risk of harm to self or others Substance use: tobacco, alcohol Assessments administered: PHQ9: 12/ GAD7: 18  Diagnosis: Anxiety and depression CPT Code: F41.8 -------------------------------------------- Other(s) present in the room: none  Time spent with patient in exam room: 25  minutes, 11:30-11:55am   Depression screen [facility] 2/9 12/11/2015 12/11/2015 05/22/2015  Decreased Interest 0 0 0  Down, Depressed, Hopeless PHQ - 2 Score Altered sleeping 3 - 1  Tired, decreased energy 2 - 1  Change in appetite 3 - 1  Feeling bad or failure about yourself  0 - 0  Trouble concentrating 0 - 0  Moving slowly or fidgety/restless 2 - 0  Suicidal thoughts 1 - 1  PHQ-9 Score 12 - 6      GAD 7 : Generalized Anxiety Score 12/11/2015  Nervous, Anxious, on Edge 2  Control/stop worrying 3  Worry too much - different things 3  Trouble relaxing 3  Restless 2  Easily annoyed or irritable 3  Afraid - awful might happen 2  Total GAD 7 Score 18

## 2015-12-11 NOTE — Progress Notes (Signed)
Patient ID: Lisa Pham, female   DOB: [birth year], 60 y.o.   MRN: [id number] Subjective:  Lisa Pham is a 60 y.o. female with hypertension, COPD, and HLD. Patient reports that she has been out of all of her medications for over 6 months due to financial reasons. She states that she has tried to apply for insurance but has been unsuccessful. Patient notes that she has been having headaches daily with multiple joint pains. She presents with elevated blood pressure but denies symptoms of headache, chest pain, palpitations, edema. Patient would like lab test today. She notes that in the past she was being tested for parathyroid dysfunction and needs to know if it is normal. She has printed records from her last provider in [place] with her today.   Current Outpatient Prescriptions  Medication Sig Dispense Refill  . albuterol (PROVENTIL HFA;VENTOLIN HFA) 108 (90 BASE) MCG/ACT inhaler Inhale 1-2 puffs into the lungs every 6 (six) hours as needed for wheezing or shortness of breath. 1 Inhaler 2  . amLODipine (NORVASC) 10 MG tablet Take 1 tablet (10 mg total) by mouth daily. 30 tablet 2  . aspirin-acetaminophen-caffeine (EXCEDRIN MIGRAINE) 250-250-65 MG per tablet Take 2 tablets by mouth every 6 (six) hours as needed for migraine.    Marland Kitchen atorvastatin (LIPITOR) 40 MG tablet Take 1 tablet (40 mg total) by mouth daily. 30 tablet 2  . ciprofloxacin (CIPRO) 500 MG tablet Take 1 tablet (500 mg total) by mouth 2 (two) times daily. For 10 more days (Patient not taking: Reported on 06/05/2015) 20 tablet 0  . fluticasone (FLONASE) 50 MCG/ACT nasal spray Place 1 spray into both nostrils daily. 16 g 1  . gabapentin (NEURONTIN) 600 MG tablet Take 1 tablet (600 mg total) by mouth 3 (three) times daily. 90 tablet 2  . losartan (COZAAR) 100 MG tablet Take 1 tablet (100 mg total) by mouth daily. 30 tablet 2  . metroNIDAZOLE (FLAGYL) 500 MG tablet Take 1 tablet (500 mg total) by mouth every 8 (eight) hours. For 10 more days  (Patient not taking: Reported on 06/05/2015) 30 tablet 0  . montelukast (SINGULAIR) 10 MG tablet Take 1 tablet (10 mg total) by mouth at bedtime. 30 tablet 2  . oxyCODONE (OXY IR/ROXICODONE) 5 MG immediate release tablet Take 1-2 tablets (5-10 mg total) by mouth every 4 (four) hours as needed for moderate pain. (Patient not taking: Reported on 05/22/2015) 30 tablet 0  . pantoprazole (PROTONIX) 40 MG tablet Take 1 tablet (40 mg total) by mouth daily. 30 tablet 3  . promethazine (PHENERGAN) 25 MG tablet Take 1 tablet (25 mg total) by mouth every 6 (six) hours as needed for nausea. 10 tablet 0  . ranitidine (ZANTAC) 150 MG capsule Take 1 capsule (150 mg total) by mouth 2 (two) times daily. 60 capsule 0  . tiotropium (SPIRIVA) 18 MCG inhalation capsule Place 1 capsule (18 mcg total) into inhaler and inhale daily. 30 capsule 2  . topiramate (TOPAMAX) 50 MG tablet Take 1 tablet (50 mg total) by mouth 2 (two) times daily. 60 tablet 2  . traZODone (DESYREL) 100 MG tablet Take 1 tablet (100 mg total) by mouth at bedtime as needed for sleep. 30 tablet 2  . venlafaxine (EFFEXOR) 25 MG tablet Take 1 tablet (25 mg total) by mouth 2 (two) times daily. 60 tablet 2  . vitamin B-12 (CYANOCOBALAMIN) 1000 MCG tablet Take 1,000 mcg by mouth daily.     No current facility-administered medications for this visit.  ROS: not taking medications regularly as instructed, no TIA's, no chest pain on exertion, no dyspnea on exertion, no swelling of ankles and no palpitations. All other systems negative other than what is stated.  Objective:  BP 185/103 mmHg  Pulse 68  Temp(Src) 98 F (36.7 C)  Resp 16  Ht  (1.626 m)  Wt 155 lb 9.6 oz (70.58 kg)  BMI 26.70 kg/m2  SpO2 100%  Appearance alert, well appearing, and in no distress, oriented to person, place, and time and normal appearing weight. General exam BP noted to be severely elevated today in office, S1, S2 normal, no gallop, no murmur, chest clear, no JVD, no  HSM, no edema. Candida dermatitis under bilateral breast. No joint tenderness or inflammation.  Lab review: orders written for new lab studies as appropriate; see orders.   Assessment:   Lisa Pham was seen today for follow-up.  Diagnoses and all orders for this visit:  Elevated blood pressure -     cloNIDine (CATAPRES) tablet 0.1 mg; Take 1 tablet (0.1 mg total) by mouth once in office BP stable before discharged.  Essential hypertension -     amLODipine (NORVASC) 10 MG tablet; Take 1 tablet (10 mg total) by mouth daily. -     atorvastatin (LIPITOR) 40 MG tablet; Take 1 tablet (40 mg total) by mouth daily. -     losartan (COZAAR) 100 MG tablet; Take 1 tablet (100 mg total) by mouth daily. -     Basic Metabolic Panel -     Lipid panel Patient blood pressure remains elevated today, will increase BP medication and have patient to return in 2 weeks for blood pressure recheck with nurse. Stressed diet changes, regular exercise regimen, and modifiable risk factors. Will follow up with CMP as needed, Will follow up with patient in 3-6 months.   HLD (hyperlipidemia) -     atorvastatin (LIPITOR) 40 MG tablet; Take 1 tablet (40 mg total) by mouth daily. Education provided on proper lifestyle changes in order to lower cholesterol. Patient advised to maintain healthy weight and to keep total fat intake at 25-35% of total calories and carbohydrates 50-60% of total daily calories. Explained how high cholesterol places patient at risk for heart disease. Patient placed on appropriate medication and repeat labs in 6 months   Osteopenia -     PTH, intact and calcium -     Vitamin D, 25-hydroxy -     TSH -     T4, Free Review of old charts revealed that her last Dexa scan showed osteopenia. I have advised her to get calcium with Vitamin D supplements.   Candidal dermatitis -    Begin  nystatin (MYCOSTATIN/NYSTOP) 100000 UNIT/GM POWD; May use twice daily under breast     Plan:  Reviewed diet, exercise  and weight control. Recommended sodium restriction. Copy of written low fat low cholesterol diet provided and reviewed. Cardiovascular risk and specific lipid/LDL goals reviewed..  Return in about 2 weeks (around 12/25/2015) for Nurse Visit-BP check and 3 mo PCP .  Ambrose Finland, NP 12/11/2015 8:45 PM

## 2015-12-11 NOTE — Progress Notes (Signed)
Patient here for follow up on her HTN and cholesterol Patient presents in office with elevated blood pressure 0.1mg  catapress given per office protocol Patient has not taken any of her medications since Sept of this year Patient states she has not been able to afford her prescriptions Patient is very frustrated with the process through ssi and trying to get assistance

## 2015-12-12 ENCOUNTER — Telehealth: Payer: Self-pay | Admitting: Clinical

## 2015-12-12 LAB — VITAMIN D 25 HYDROXY (VIT D DEFICIENCY, FRACTURES): VIT D 25 HYDROXY: 21 ng/mL — AB (ref 30–100)

## 2015-12-12 MED FILL — MONTELUKAST SOD 10 MG TAB: 10 | 30 days supply | Qty: 30 | Fill #0

## 2015-12-12 MED FILL — traZODone HCL 100 MG TABS: 100 | 30 days supply | Qty: 30 | Fill #0

## 2015-12-12 MED FILL — GABAPENTIN 600 MG TABLET: 600 | 30 days supply | Qty: 90 | Fill #0

## 2015-12-12 MED FILL — VENLAFAXINE HCL 25 MG TAB: 25 | 30 days supply | Qty: 60 | Fill #0

## 2015-12-12 MED FILL — FLUTICASONE PROP 50 MCG SPR: 50 | 30 days supply | Qty: 16 | Fill #0

## 2015-12-12 MED FILL — TOPIRAMATE 50 MG TABLET: 50 | 30 days supply | Qty: 60 | Fill #0

## 2015-12-12 NOTE — Telephone Encounter (Signed)
Pt called to make sure we know at [facility] that she went to social security office today, talked to Britta Mccreedy Burns([phone]), who helped her through the disability process that Ms. Litaker started on 07-05-15. She is also working with Ms. Dennis at [facility] on her disability claims at [phone]. She is going to [employer] program tonight, and will call back to [facility] later this week, to decide upon a time for a f/u appointment.

## 2015-12-13 ENCOUNTER — Telehealth: Payer: Self-pay

## 2015-12-13 ENCOUNTER — Telehealth: Payer: Self-pay | Admitting: Internal Medicine

## 2015-12-13 LAB — PTH, INTACT AND CALCIUM

## 2015-12-13 MED ORDER — ALBUTEROL SULFATE HFA 108 (90 BASE) MCG/ACT IN AERS: 1.0000 | INHALATION_SPRAY | Freq: Four times a day (QID) | RESPIRATORY_TRACT | Status: DC | PRN

## 2015-12-13 NOTE — Telephone Encounter (Signed)
[facility] called ([id number]) Did not receive frozen specimen for  Parathyroid Test Unable to run from 12/11/2015

## 2015-12-13 NOTE — Telephone Encounter (Signed)
Pt. Needs a prescription for albuterol...Marland KitchenMarland KitchenMarland Kitchenplease follow up with patien

## 2015-12-13 NOTE — Telephone Encounter (Signed)
Returned patient phone call Patient requesting a refill on [facility]

## 2015-12-16 ENCOUNTER — Telehealth: Payer: Self-pay

## 2015-12-16 DIAGNOSIS — Z0000 Encounter for general adult medical examination without abnormal findings: Secondary | ICD-10-CM

## 2015-12-16 NOTE — Telephone Encounter (Signed)
-----   Message from Ambrose Finland, NP sent at 12/15/2015  5:15 PM EST ----- Lisa Pham---this patient will need to come back due to improper collection of intact PTH and calcium. Her TSH was low but the free t4 was normal. I will recheck this level in 3 months. Cholesterol elevated but it is likely elevated because she has been out of all her medications for several months. Please stress to her to start back on medication, exercise, cut out fried foods and excess carbs

## 2015-12-16 NOTE — Telephone Encounter (Signed)
Spoke with patient and she is aware I will need to bring her Back in to run PTH and calcium.  There was a problem with the specimen At the lab and needs to be re drawn Patient will call me back with a day that is convenient for her to come back

## 2015-12-20 ENCOUNTER — Ambulatory Visit (HOSPITAL_BASED_OUTPATIENT_CLINIC_OR_DEPARTMENT_OTHER): Payer: Medicaid Other | Admitting: Clinical

## 2015-12-20 ENCOUNTER — Ambulatory Visit: Payer: Medicaid Other | Attending: Internal Medicine

## 2015-12-20 ENCOUNTER — Telehealth: Payer: Self-pay

## 2015-12-20 ENCOUNTER — Telehealth: Payer: Self-pay | Admitting: Internal Medicine

## 2015-12-20 DIAGNOSIS — F418 Other specified anxiety disorders: Secondary | ICD-10-CM

## 2015-12-20 DIAGNOSIS — F32A Depression, unspecified: Secondary | ICD-10-CM

## 2015-12-20 DIAGNOSIS — F329 Major depressive disorder, single episode, unspecified: Secondary | ICD-10-CM

## 2015-12-20 DIAGNOSIS — Z0000 Encounter for general adult medical examination without abnormal findings: Secondary | ICD-10-CM

## 2015-12-20 DIAGNOSIS — F419 Anxiety disorder, unspecified: Principal | ICD-10-CM

## 2015-12-20 MED FILL — PROAIR HFA 90 MCG INHALER: 108 (90 BAS | 25 days supply | Qty: 9 | Fill #0

## 2015-12-20 NOTE — Telephone Encounter (Signed)
Patient came in requesting a prescription for a Cane, Shower chair, toilet seat and a grip for bath tub. Please follow up.

## 2015-12-20 NOTE — Progress Notes (Signed)
ASSESSMENT: Pt currently experiencing reduction in symptoms of anxiety and depression. [facility], and would benefit from Solution-Focused therapy regarding continuing to cope with symptoms of anxiety and depression. age of Change: contemplative  PLAN: 1. F/U with behavioral health consultant in one month 2. Psychiatric Medications: none. 3. Behavioral recommendation(s):   -Go to [employer] in [place] to update [place] ID -Contact Medicaid transportation to set up for future appointments  -Continue to enjoy moments of peace and quiet -Set up f/u appointment with PCP for medical equipment referrals -[facility] concerning obtaining [facility] health aide services  SUBJECTIVE: Pt. referred by Dr Luna GlasgowKeck for symptoms of anxiety and depression Pt. reports the following symptoms/concerns: Pt states she is happy today that her Medicaid has gone through, and that her primary concern is need for home health aide and need for medical equipment (cane, toilet seat, bath bar, shower seat), along with need to update her ID to change in address and to set up transportation. She says that, although she does not completely like her new residence, she is beginning to enjoy the peace and quiet of living alone, and being able to get a full nights sleep uninterrupted; is feeling less anxious, less depression, has a glass of wine or one beer, after dinner, but "no hard whiskey" anymore.  Duration of problem: About a week  Severity of problem: mild  OJECTIVE: Orientation & Cognition: Oriented x3. Thought processes normal and appropriate to situation. Mood: appropriate Appearance: appropriate Risk of harm to self or others: appropriate Substance use: alcohol Assessments administered: none  Diagnosis: Anxiety and depression CPT code: F41.8  ------------------------------------------- Other(s) present in the room: none  Time spent with patient in exam room: 20 minutes,  10:00-10:20am   Dep[facility] 2/9 12/11/2015 12/11/2015 05/22/2015  Decreased Interest 0 0 0  Down, Depressed, Hopeless 1 1 2   PHQ - 2 Score 1 1 2   Altered sleeping 3 - 1  Tired, decreased energy 2 - 1  Change in appetite 3 - 1  Feeling bad or failure about yourself  0 - 0  Trouble concentrating 0 - 0  Moving slowly or fidgety/restless 2 - 0  Suicidal thoughts 1 - 1  PHQ-9 Score 12 - 6    GAD 7 : Generalized Anxiety Score 12/11/2015  Nervous, Anxious, on Edge 2  Control/stop worrying 3  Worry too much - different things 3  Trouble relaxing 3  Restless 2  Easily annoyed or irritable 3  Afraid - awful might happen 2  Total GAD 7 Score 18

## 2015-12-20 NOTE — Telephone Encounter (Signed)
Returned patient phone call Patient not available Message left on voice mail to return  Our call

## 2015-12-23 LAB — PTH, INTACT AND CALCIUM
CALCIUM: 9.7 mg/dL (ref 8.4–10.5)
PTH: 63 pg/mL (ref 14–64)

## 2015-12-24 ENCOUNTER — Telehealth: Payer: Self-pay

## 2015-12-24 NOTE — Telephone Encounter (Signed)
Tried to contact patient this am Patient not available  Message left on voice mail to return our call 

## 2015-12-24 NOTE — Telephone Encounter (Signed)
-----   Message from Ambrose FinlandValerie A Keck, NP sent at 12/23/2015  7:46 PM EST ----- Labs normal

## 2015-12-24 NOTE — Progress Notes (Signed)
Patient ID: Lisa CornwallLinda Pham, female   DOB: 04/22/1956, 60 y.o.   MRN: [id number] Patient had to reschedule appointment. Left without being seen

## 2016-01-02 ENCOUNTER — Telehealth: Payer: Self-pay | Admitting: Clinical

## 2016-01-02 NOTE — Telephone Encounter (Signed)
Return pt call, Lisa Pham expresses confusion over paperwork she has received concerning Medicaid, that asks her to pick a PCP, as well as paperwork about need for her to request retroactive Medicaid for past medical bills. [facility], it is recommended that she pick [facility]/[name], as her PCP; it is also recommended that she keep all medical bills in one location, and to either call or go to local DSS, to inquire about retroactive Medicaid. Pt expresses verbal understanding and agreement, and says that she will go to the DSS [place], and will take all of her paperwork and medical bills with her, to let them know which PCP she is requesting, and to apply for retroactive Medicaid to pay past medical bills.

## 2016-01-07 ENCOUNTER — Telehealth: Payer: Self-pay | Admitting: Clinical

## 2016-01-07 ENCOUNTER — Telehealth: Payer: Self-pay

## 2016-01-07 NOTE — Telephone Encounter (Signed)
Pt. Called requesting to speak to the nurse regarding on have she can get a shower chair and a toilet seating. Please f/u with the pt.

## 2016-01-07 NOTE — Telephone Encounter (Signed)
Lisa Pham wants to talk, had negative interaction with neighbor; feeling unsafe after expressing disapproval of neighbors smoking marijuana outside her apartment. Pt says she feels the stress is making left side of face feel "a little numb", and she has headache. Recommend that Lisa Pham call 911 if she thinks she may be having a stroke. Pt says she has had previous stroke, does not think she is having one now, continued to talk for another 30 minutes, headache subsided, face does not feel  numb at end of conversation. Lisa Pham says she will call 911 and "bang on wall" for neighbor to check on her, if she feels like she needs to do so, but denies needing to call anyone else now.

## 2016-01-07 NOTE — Telephone Encounter (Signed)
Returned patient phone call Patient is requesting a shower chair,toilet seat cane and safety rail for shower Is this something you can write . These items were lost when she moved

## 2016-01-07 NOTE — Telephone Encounter (Signed)
Pt. Also returned call for her lab results.  Please f/u

## 2016-01-13 NOTE — Telephone Encounter (Signed)
With insurance she needs a visit. May get her one in next couple of weeks

## 2016-01-15 ENCOUNTER — Telehealth: Payer: Self-pay

## 2016-01-15 NOTE — Telephone Encounter (Signed)
Tried to return call to patient Message states the person you are tying to reach is  Not available Please try your call again later

## 2016-01-22 ENCOUNTER — Other Ambulatory Visit: Payer: Self-pay | Admitting: Internal Medicine

## 2016-01-22 MED FILL — ?AMLODIPINE BESYLATE 10 MG: 10 | 30 days supply | Qty: 30 | Fill #1

## 2016-01-22 MED FILL — ATORVASTATIN 40 MG TABLET: 40 | 30 days supply | Qty: 30 | Fill #1

## 2016-01-22 MED FILL — LOSARTAN POTASSIUM 100 MG T: 100 | 30 days supply | Qty: 30 | Fill #1

## 2016-01-22 NOTE — Telephone Encounter (Signed)
Patient is calling to request prescription for a Toilet seat, shower chair, and cane....please follow up with patient

## 2016-01-27 NOTE — Telephone Encounter (Signed)
Spoke with patient, patient verified name and DOB. Patient was given the message to make an appt in 2weeks to discuss the shower chair, cane and toilet seat. Patient stated she would call back to make an appt. Patient didn't want to be transferred to the front at that time.

## 2016-01-27 NOTE — Telephone Encounter (Signed)
-----   Message from Ambrose FinlandValerie A Keck, NP sent at 01/22/2016 11:11 AM EDT ----- This patient may make appointment in 2 weeks to discuss a shower chair and toilet seat script. Thanks   ----- Message -----    From: Rae LipsJamie C McMannes, LCSWA    Sent: 01/22/2016  11:08 AM      To: Ambrose FinlandValerie A Keck, NP  Pt calling, requesting scripts for shower chair, toilet seat, cane, f/u at [phone].

## 2016-01-30 ENCOUNTER — Telehealth: Payer: Self-pay | Admitting: Clinical

## 2016-01-30 NOTE — Telephone Encounter (Signed)
Returned pt call; termination with patient, she is aware she may make an appointment prior to end April, as needed, for [facility]. Lisa Pham says that she has begun receiving 40hours/month in home health aide services, and that she will call back at a later date to make an appointment to see PCP sometime the beginning of May.

## 2016-03-04 ENCOUNTER — Ambulatory Visit: Payer: Medicaid Other | Attending: Family Medicine | Admitting: Family Medicine

## 2016-03-04 ENCOUNTER — Encounter: Payer: Self-pay | Admitting: Family Medicine

## 2016-03-04 VITALS — BP 143/81 | HR 90 | Temp 98.6°F | Resp 18 | Ht 64.0 in | Wt 161.0 lb

## 2016-03-04 DIAGNOSIS — G43009 Migraine without aura, not intractable, without status migrainosus: Secondary | ICD-10-CM | POA: Insufficient documentation

## 2016-03-04 DIAGNOSIS — J302 Other seasonal allergic rhinitis: Secondary | ICD-10-CM | POA: Insufficient documentation

## 2016-03-04 DIAGNOSIS — E7800 Pure hypercholesterolemia, unspecified: Secondary | ICD-10-CM | POA: Diagnosis not present

## 2016-03-04 DIAGNOSIS — Z7982 Long term (current) use of aspirin: Secondary | ICD-10-CM | POA: Insufficient documentation

## 2016-03-04 DIAGNOSIS — J449 Chronic obstructive pulmonary disease, unspecified: Secondary | ICD-10-CM | POA: Diagnosis not present

## 2016-03-04 DIAGNOSIS — M8580 Other specified disorders of bone density and structure, unspecified site: Secondary | ICD-10-CM | POA: Diagnosis not present

## 2016-03-04 DIAGNOSIS — Z8719 Personal history of other diseases of the digestive system: Secondary | ICD-10-CM | POA: Insufficient documentation

## 2016-03-04 DIAGNOSIS — Z792 Long term (current) use of antibiotics: Secondary | ICD-10-CM | POA: Insufficient documentation

## 2016-03-04 DIAGNOSIS — F329 Major depressive disorder, single episode, unspecified: Secondary | ICD-10-CM

## 2016-03-04 DIAGNOSIS — E785 Hyperlipidemia, unspecified: Secondary | ICD-10-CM | POA: Insufficient documentation

## 2016-03-04 DIAGNOSIS — J439 Emphysema, unspecified: Secondary | ICD-10-CM

## 2016-03-04 DIAGNOSIS — Z7951 Long term (current) use of inhaled steroids: Secondary | ICD-10-CM | POA: Insufficient documentation

## 2016-03-04 DIAGNOSIS — Z79899 Other long term (current) drug therapy: Secondary | ICD-10-CM | POA: Diagnosis not present

## 2016-03-04 DIAGNOSIS — M1990 Unspecified osteoarthritis, unspecified site: Secondary | ICD-10-CM | POA: Diagnosis not present

## 2016-03-04 DIAGNOSIS — Z79891 Long term (current) use of opiate analgesic: Secondary | ICD-10-CM | POA: Diagnosis not present

## 2016-03-04 DIAGNOSIS — K219 Gastro-esophageal reflux disease without esophagitis: Secondary | ICD-10-CM | POA: Diagnosis not present

## 2016-03-04 DIAGNOSIS — I10 Essential (primary) hypertension: Secondary | ICD-10-CM | POA: Diagnosis present

## 2016-03-04 DIAGNOSIS — D519 Vitamin B12 deficiency anemia, unspecified: Secondary | ICD-10-CM | POA: Insufficient documentation

## 2016-03-04 DIAGNOSIS — I252 Old myocardial infarction: Secondary | ICD-10-CM | POA: Diagnosis not present

## 2016-03-04 DIAGNOSIS — F32A Depression, unspecified: Secondary | ICD-10-CM

## 2016-03-04 DIAGNOSIS — F419 Anxiety disorder, unspecified: Secondary | ICD-10-CM | POA: Diagnosis not present

## 2016-03-04 MED ORDER — FLUTICASONE PROPIONATE 50 MCG/ACT NA SUSP: 1.0000 | Freq: Every day | NASAL | Status: DC

## 2016-03-04 MED ORDER — TRAZODONE HCL 100 MG PO TABS: 100.0000 mg | ORAL_TABLET | Freq: Every evening | ORAL | Status: DC | PRN

## 2016-03-04 MED ORDER — AMLODIPINE BESYLATE 10 MG PO TABS: 10.0000 mg | ORAL_TABLET | Freq: Every day | ORAL | Status: DC

## 2016-03-04 MED ORDER — TOPIRAMATE 50 MG PO TABS: 50.0000 mg | ORAL_TABLET | Freq: Two times a day (BID) | ORAL | Status: DC

## 2016-03-04 MED ORDER — TIOTROPIUM BROMIDE MONOHYDRATE 18 MCG IN CAPS: 18.0000 ug | ORAL_CAPSULE | Freq: Every day | RESPIRATORY_TRACT | Status: DC

## 2016-03-04 MED ORDER — VENLAFAXINE HCL 25 MG PO TABS: 25.0000 mg | ORAL_TABLET | Freq: Two times a day (BID) | ORAL | Status: DC

## 2016-03-04 MED ORDER — PANTOPRAZOLE SODIUM 40 MG PO TBEC: 40.0000 mg | DELAYED_RELEASE_TABLET | Freq: Every day | ORAL | Status: DC

## 2016-03-04 MED ORDER — ATORVASTATIN CALCIUM 40 MG PO TABS: 40.0000 mg | ORAL_TABLET | Freq: Every day | ORAL | Status: DC

## 2016-03-04 MED ORDER — LOSARTAN POTASSIUM 100 MG PO TABS: 100.0000 mg | ORAL_TABLET | Freq: Every day | ORAL | Status: DC

## 2016-03-04 MED ORDER — MONTELUKAST SODIUM 10 MG PO TABS: 10.0000 mg | ORAL_TABLET | Freq: Every day | ORAL | Status: DC

## 2016-03-04 MED ORDER — ALBUTEROL SULFATE HFA 108 (90 BASE) MCG/ACT IN AERS: 1.0000 | INHALATION_SPRAY | Freq: Four times a day (QID) | RESPIRATORY_TRACT | Status: DC | PRN

## 2016-03-04 MED FILL — AMLODIPINE BESYLATE 10 MG T: 10 | 30 days supply | Qty: 30 | Fill #2

## 2016-03-04 MED FILL — ATORVASTATIN 40 MG TABLET: 40 | 30 days supply | Qty: 30 | Fill #2

## 2016-03-04 MED FILL — LOSARTAN POTASSIUM 100 MG T: 100 | 30 days supply | Qty: 30 | Fill #2

## 2016-03-04 NOTE — Progress Notes (Signed)
Patient is here for FU BP  Patient took medication around 6:00am. Patient has not eaten today.  Patient complains of aching in her body and diarrhea since Monday due to over indulging in mothers day festivities.

## 2016-03-04 NOTE — Patient Instructions (Signed)
Hypertension Hypertension, commonly called high blood pressure, is when the force of blood pumping through your arteries is too strong. Your arteries are the blood vessels that carry blood from your heart throughout your body. A blood pressure reading consists of a higher number over a lower number, such as 110/72. The higher number (systolic) is the pressure inside your arteries when your heart pumps. The lower number (diastolic) is the pressure inside your arteries when your heart relaxes. Ideally you want your blood pressure below 120/80. Hypertension forces your heart to work harder to pump blood. Your arteries may become narrow or stiff. Having untreated or uncontrolled hypertension can cause heart attack, stroke, kidney disease, and other problems. RISK FACTORS Some risk factors for high blood pressure are controllable. Others are not.  Risk factors you cannot control include:   Race. You may be at higher risk if you are African American.  Age. Risk increases with age.  Gender. Men are at higher risk than women before age 45 years. After age 65, women are at higher risk than men. Risk factors you can control include:  Not getting enough exercise or physical activity.  Being overweight.  Getting too much fat, sugar, calories, or salt in your diet.  Drinking too much alcohol. SIGNS AND SYMPTOMS Hypertension does not usually cause signs or symptoms. Extremely high blood pressure (hypertensive crisis) may cause headache, anxiety, shortness of breath, and nosebleed. DIAGNOSIS To check if you have hypertension, your health care provider will measure your blood pressure while you are seated, with your arm held at the level of your heart. It should be measured at least twice using the same arm. Certain conditions can cause a difference in blood pressure between your right and left arms. A blood pressure reading that is higher than normal on one occasion does not mean that you need treatment. If  it is not clear whether you have high blood pressure, you may be asked to return on a different day to have your blood pressure checked again. Or, you may be asked to monitor your blood pressure at home for 1 or more weeks. TREATMENT Treating high blood pressure includes making lifestyle changes and possibly taking medicine. Living a healthy lifestyle can help lower high blood pressure. You may need to change some of your habits. Lifestyle changes may include:  Following the DASH diet. This diet is high in fruits, vegetables, and whole grains. It is low in salt, red meat, and added sugars.  Keep your sodium intake below 2,300 mg per day.  Getting at least 30-45 minutes of aerobic exercise at least 4 times per week.  Losing weight if necessary.  Not smoking.  Limiting alcoholic beverages.  Learning ways to reduce stress. Your health care provider may prescribe medicine if lifestyle changes are not enough to get your blood pressure under control, and if one of the following is true:  You are 18-59 years of age and your systolic blood pressure is above 140.  You are 60 years of age or older, and your systolic blood pressure is above 150.  Your diastolic blood pressure is above 90.  You have diabetes, and your systolic blood pressure is over 140 or your diastolic blood pressure is over 90.  You have kidney disease and your blood pressure is above 140/90.  You have heart disease and your blood pressure is above 140/90. Your personal target blood pressure may vary depending on your medical conditions, your age, and other factors. HOME CARE INSTRUCTIONS    Have your blood pressure rechecked as directed by your health care provider.   Take medicines only as directed by your health care provider. Follow the directions carefully. Blood pressure medicines must be taken as prescribed. The medicine does not work as well when you skip doses. Skipping doses also puts you at risk for  problems.  Do not smoke.   Monitor your blood pressure at home as directed by your health care provider. SEEK MEDICAL CARE IF:   You think you are having a reaction to medicines taken.  You have recurrent headaches or feel dizzy.  You have swelling in your ankles.  You have trouble with your vision. SEEK IMMEDIATE MEDICAL CARE IF:  You develop a severe headache or confusion.  You have unusual weakness, numbness, or feel faint.  You have severe chest or abdominal pain.  You vomit repeatedly.  You have trouble breathing. MAKE SURE YOU:   Understand these instructions.  Will watch your condition.  Will get help right away if you are not doing well or get worse.   This information is not intended to replace advice given to you by your health care provider. Make sure you discuss any questions you have with your health care provider.   Document Released: 10/05/2005 Document Revised: 02/19/2015 Document Reviewed: 07/28/2013 Elsevier Interactive Patient Education 2016 [employer]  

## 2016-03-04 NOTE — Progress Notes (Signed)
Subjective:  Patient ID: Lisa Pham, female    DOB: 1956-10-04  Age: 60 y.o. MRN: [id number]  CC: Follow-up   HPI Lisa Pham is a 60 year old female with a history of hypertension, COPD, seasonal allergies, GERD, depression, migraines, hyperlipidemia who comes in for a follow-up visit.  At her last office visit her blood pressure was elevated due to running out of all her medications and blood pressure is better today; she endorses compliance with her medications and low-sodium diet but does not exercise. She did overindulge this last weekend on mothers day and is having some body aches as a result.  She denies reflux symptoms, her migraines are controlled on Topamax. Complains of worsening seasonal allergies ever since she relocated to [facility] a little less than a year ago with complaints of sneezing and coughing despite compliance with her COPD medications and flonase and singulair. She finds that that air is humid outside and when she stays home the air condition affects her breathing. She denies fever  She would like a prescription for the [facility] chair, a cane and a 3 in 1 commode.   Past Medical History  Diagnosis Date  . Hypertension   . COPD (chronic obstructive pulmonary disease) (HCC)   . High cholesterol   . Heart murmur   . Myocardial infarction [facility]) ~ 2011    "mild"  . Pneumonia ~ 2013  . B12 deficiency anemia   . GERD (gastroesophageal reflux disease)   . History of stomach ulcers   . Migraine     "3-4 times/wk or more" (05/14/2015)  . Chronic lower back pain   . Arthritis     "all over" (05/14/2015)  . Anxiety   . Depression   . Alcohol withdrawal [facility])      Outpatient Prescriptions Prior to Visit  Medication Sig Dispense Refill  . aspirin-acetaminophen-caffeine (EXCEDRIN MIGRAINE) 250-250-65 MG per tablet Take 2 tablets by mouth every 6 (six) hours as needed for migraine.    . gabapentin (NEURONTIN) 600 MG tablet Take 1 tablet (600 mg total)  by mouth 3 (three) times daily. 90 tablet 2  . nystatin (MYCOSTATIN/NYSTOP) 100000 UNIT/GM POWD May use twice daily under breast 30 g 1  . ranitidine (ZANTAC) 150 MG capsule Take 1 capsule (150 mg total) by mouth 2 (two) times daily. 60 capsule 0  . vitamin B-12 (CYANOCOBALAMIN) 1000 MCG tablet Take 1,000 mcg by mouth daily.    Marland Kitchen albuterol (PROVENTIL HFA;VENTOLIN HFA) 108 (90 Base) MCG/ACT inhaler Inhale 1-2 puffs into the lungs every 6 (six) hours as needed for wheezing or shortness of breath. 1 Inhaler 2  . amLODipine (NORVASC) 10 MG tablet Take 1 tablet (10 mg total) by mouth daily. 30 tablet 2  . atorvastatin (LIPITOR) 40 MG tablet Take 1 tablet (40 mg total) by mouth daily. 30 tablet 2  . fluticasone (FLONASE) 50 MCG/ACT nasal spray Place 1 spray into both nostrils daily. 16 g 1  . losartan (COZAAR) 100 MG tablet Take 1 tablet (100 mg total) by mouth daily. 30 tablet 2  . montelukast (SINGULAIR) 10 MG tablet Take 1 tablet (10 mg total) by mouth at bedtime. 30 tablet 2  . pantoprazole (PROTONIX) 40 MG tablet Take 1 tablet (40 mg total) by mouth daily. 30 tablet 3  . promethazine (PHENERGAN) 25 MG tablet Take 1 tablet (25 mg total) by mouth every 6 (six) hours as needed for nausea. 10 tablet 0  . tiotropium (SPIRIVA) 18 MCG inhalation capsule Place 1 capsule (18  mcg total) into inhaler and inhale daily. 30 capsule 2  . topiramate (TOPAMAX) 50 MG tablet Take 1 tablet (50 mg total) by mouth 2 (two) times daily. 60 tablet 2  . traZODone (DESYREL) 100 MG tablet Take 1 tablet (100 mg total) by mouth at bedtime as needed for sleep. 30 tablet 2  . venlafaxine (EFFEXOR) 25 MG tablet Take 1 tablet (25 mg total) by mouth 2 (two) times daily. 60 tablet 2  . ciprofloxacin (CIPRO) 500 MG tablet Take 1 tablet (500 mg total) by mouth 2 (two) times daily. For 10 more days (Patient not taking: Reported on 06/05/2015) 20 tablet 0  . metroNIDAZOLE (FLAGYL) 500 MG tablet Take 1 tablet (500 mg total) by mouth every 8  (eight) hours. For 10 more days (Patient not taking: Reported on 06/05/2015) 30 tablet 0  . oxyCODONE (OXY IR/ROXICODONE) 5 MG immediate release tablet Take 1-2 tablets (5-10 mg total) by mouth every 4 (four) hours as needed for moderate pain. (Patient not taking: Reported on 05/22/2015) 30 tablet 0   No facility-administered medications prior to visit.    ROS Review of Systems  Constitutional: Negative for activity change, appetite change and fatigue.  HENT: Positive for sneezing. Negative for congestion, sinus pressure and sore throat.   Eyes: Negative for visual disturbance.  Respiratory: Positive for cough. Negative for chest tightness, shortness of breath and wheezing.   Cardiovascular: Negative for chest pain and palpitations.  Gastrointestinal: Negative for abdominal pain, constipation and abdominal distention.  Endocrine: Negative for polydipsia.  Genitourinary: Negative for dysuria and frequency.  Musculoskeletal: Positive for myalgias. Negative for back pain and arthralgias.  Skin: Negative for rash.  Neurological: Negative for tremors, light-headedness and numbness.  Hematological: Does not bruise/bleed easily.  Psychiatric/Behavioral: Negative for behavioral problems and agitation.    Objective:  BP 143/81 mmHg  Pulse 90  Temp(Src) 98.6 F (37 C) (Oral)  Resp 18  Ht  (1.626 m)  Wt 161 lb (73.029 kg)  BMI 27.62 kg/m2  SpO2 100%  BP/Weight 03/04/2016 12/11/2015 12/04/2015  Systolic BP 143 185 160  Diastolic BP 81 103 92  Wt. (Lbs) 161 155.6 -  BMI 27.62 26.7 -      Physical Exam  Constitutional: She is oriented to person, place, and time. She appears well-developed and well-nourished.  Cardiovascular: Normal rate, normal heart sounds and intact distal pulses.   No murmur heard. Pulmonary/Chest: Effort normal and breath sounds normal. She has no wheezes. She has no rales. She exhibits no tenderness.  Abdominal: Soft. Bowel sounds are normal. She exhibits no  distension and no mass. There is no tenderness.  Musculoskeletal: Normal range of motion.  Neurological: She is alert and oriented to person, place, and time.  Skin: Skin is warm and dry.  Psychiatric: She has a normal mood and affect.   Lipid Panel     Component Value Date/Time   CHOL 209* 12/11/2015 1118   TRIG 151* 12/11/2015 1118   HDL 78 12/11/2015 1118   CHOLHDL 2.7 12/11/2015 1118   VLDL 30 12/11/2015 1118   LDLCALC 101 12/11/2015 1118     CMP Latest Ref Rng 12/20/2015 12/11/2015 12/11/2015  Glucose 65 - 99 mg/dL - 161(W) -  BUN 7 - 25 mg/dL - 9 -  Creatinine 9.60 - 0.99 mg/dL - 4.54 -  Sodium 098 - 146 mmol/L - 142 -  Potassium 3.5 - 5.3 mmol/L - 3.5 -  Chloride 98 - 110 mmol/L - 108 -  CO2 20 -  31 mmol/L - 24 -  Calcium 8.4 - 10.5 mg/dL 9.7 9.6 CANCELED  Total Protein 6.5 - 8.1 g/dL - - -  Total Bilirubin 0.3 - 1.2 mg/dL - - -  Alkaline Phos 38 - 126 U/L - - -  AST 15 - 41 U/L - - -  ALT 14 - 54 U/L - - -      Assessment & Plan:   1. Essential hypertension Slightly elevated above goal of less than 140/90 Low-sodium, DASH diet - losartan (COZAAR) 100 MG tablet; Take 1 tablet (100 mg total) by mouth daily.  Dispense: 30 tablet; Refill: 2 - amLODipine (NORVASC) 10 MG tablet; Take 1 tablet (10 mg total) by mouth daily.  Dispense: 30 tablet; Refill: 2  2. Pulmonary emphysema, unspecified emphysema type (HCC) Uncontrolled which could be secondary to pollens Symbicort added to regimen - tiotropium (SPIRIVA) 18 MCG inhalation capsule; Place 1 capsule (18 mcg total) into inhaler and inhale daily.  Dispense: 30 capsule; Refill: 2 - albuterol (PROVENTIL HFA;VENTOLIN HFA) 108 (90 Base) MCG/ACT inhaler; Inhale 1-2 puffs into the lungs every 6 (six) hours as needed for wheezing or shortness of breath.  Dispense: 1 Inhaler; Refill: 2  3. Depression Controlled - venlafaxine (EFFEXOR) 25 MG tablet; Take 1 tablet (25 mg total) by mouth 2 (two) times daily.  Dispense: 60 tablet;  Refill: 2 - traZODone (DESYREL) 100 MG tablet; Take 1 tablet (100 mg total) by mouth at bedtime as needed for sleep.  Dispense: 30 tablet; Refill: 2  4. Migraine without aura and without status migrainosus, not intractable Controlled - topiramate (TOPAMAX) 50 MG tablet; Take 1 tablet (50 mg total) by mouth 2 (two) times daily.  Dispense: 60 tablet; Refill: 2  5. Gastroesophageal reflux disease, esophagitis presence not specified Stable - pantoprazole (PROTONIX) 40 MG tablet; Take 1 tablet (40 mg total) by mouth daily.  Dispense: 30 tablet; Refill: 3  6. HLD (hyperlipidemia) Controlled - atorvastatin (LIPITOR) 40 MG tablet; Take 1 tablet (40 mg total) by mouth daily.  Dispense: 30 tablet; Refill: 2  8. Osteopenia Stable  9. Seasonal allergies Uncontrolled Provided some masks to the patient and so she could use when she goes outside - montelukast (SINGULAIR) 10 MG tablet; Take 1 tablet (10 mg total) by mouth at bedtime.  Dispense: 30 tablet; Refill: 2 - fluticasone (FLONASE) 50 MCG/ACT nasal spray; Place 1 spray into both nostrils daily.  Dispense: 16 g; Refill: 1   Meds ordered this encounter  Medications  . topiramate (TOPAMAX) 50 MG tablet    Sig: Take 1 tablet (50 mg total) by mouth 2 (two) times daily.    Dispense:  60 tablet    Refill:  2  . pantoprazole (PROTONIX) 40 MG tablet    Sig: Take 1 tablet (40 mg total) by mouth daily.    Dispense:  30 tablet    Refill:  3  . atorvastatin (LIPITOR) 40 MG tablet    Sig: Take 1 tablet (40 mg total) by mouth daily.    Dispense:  30 tablet    Refill:  2  . venlafaxine (EFFEXOR) 25 MG tablet    Sig: Take 1 tablet (25 mg total) by mouth 2 (two) times daily.    Dispense:  60 tablet    Refill:  2  . tiotropium (SPIRIVA) 18 MCG inhalation capsule    Sig: Place 1 capsule (18 mcg total) into inhaler and inhale daily.    Dispense:  30 capsule    Refill:  2  . losartan (COZAAR) 100 MG tablet    Sig: Take 1 tablet (100 mg total) by  mouth daily.    Dispense:  30 tablet    Refill:  2  . traZODone (DESYREL) 100 MG tablet    Sig: Take 1 tablet (100 mg total) by mouth at bedtime as needed for sleep.    Dispense:  30 tablet    Refill:  2  . amLODipine (NORVASC) 10 MG tablet    Sig: Take 1 tablet (10 mg total) by mouth daily.    Dispense:  30 tablet    Refill:  2  . montelukast (SINGULAIR) 10 MG tablet    Sig: Take 1 tablet (10 mg total) by mouth at bedtime.    Dispense:  30 tablet    Refill:  2  . albuterol (PROVENTIL HFA;VENTOLIN HFA) 108 (90 Base) MCG/ACT inhaler    Sig: Inhale 1-2 puffs into the lungs every 6 (six) hours as needed for wheezing or shortness of breath.    Dispense:  1 Inhaler    Refill:  2  . fluticasone (FLONASE) 50 MCG/ACT nasal spray    Sig: Place 1 spray into both nostrils daily.    Dispense:  16 g    Refill:  1    Follow-up: Return in about 3 weeks (around 03/25/2016) for Complete physical exam. And fasting labs; also requests referral to dentist and ophthalmologist which will be done at her next office visit.  Jaclyn ShaggyEnobong Amao MD

## 2016-03-05 MED ORDER — BUDESONIDE-FORMOTEROL FUMARATE 160-4.5 MCG/ACT IN AERO: 2.0000 | INHALATION_SPRAY | Freq: Two times a day (BID) | RESPIRATORY_TRACT | Status: DC

## 2016-03-06 ENCOUNTER — Telehealth: Payer: Self-pay | Admitting: Family Medicine

## 2016-03-06 NOTE — Telephone Encounter (Signed)
Pt. Called stating she was giving an Rx for a bath chair, cane, and a 3 in 1 bed. Pt. Does not remember where her nurse had advised her to go to get her supplies. Please f/u

## 2016-03-09 NOTE — Telephone Encounter (Signed)
Place call to patient, patient verified name and DOB. Patient informed me that she was able to find out where she's to go for medical supply pickup location.

## 2016-03-30 ENCOUNTER — Ambulatory Visit: Payer: Medicaid Other | Admitting: Family Medicine

## 2016-04-13 ENCOUNTER — Encounter: Payer: Self-pay | Admitting: Family Medicine

## 2016-04-13 ENCOUNTER — Ambulatory Visit: Payer: Medicaid Other | Attending: Family Medicine | Admitting: Family Medicine

## 2016-04-13 VITALS — BP 126/76 | HR 80 | Temp 98.3°F | Resp 16 | Ht 64.5 in | Wt 166.6 lb

## 2016-04-13 DIAGNOSIS — Z79899 Other long term (current) drug therapy: Secondary | ICD-10-CM | POA: Diagnosis not present

## 2016-04-13 DIAGNOSIS — Z23 Encounter for immunization: Secondary | ICD-10-CM | POA: Diagnosis not present

## 2016-04-13 DIAGNOSIS — Z1159 Encounter for screening for other viral diseases: Secondary | ICD-10-CM

## 2016-04-13 DIAGNOSIS — Z0000 Encounter for general adult medical examination without abnormal findings: Secondary | ICD-10-CM

## 2016-04-13 DIAGNOSIS — Z1239 Encounter for other screening for malignant neoplasm of breast: Secondary | ICD-10-CM

## 2016-04-13 DIAGNOSIS — I10 Essential (primary) hypertension: Secondary | ICD-10-CM | POA: Insufficient documentation

## 2016-04-13 DIAGNOSIS — Z1211 Encounter for screening for malignant neoplasm of colon: Secondary | ICD-10-CM | POA: Diagnosis not present

## 2016-04-13 DIAGNOSIS — Z7982 Long term (current) use of aspirin: Secondary | ICD-10-CM | POA: Diagnosis not present

## 2016-04-13 DIAGNOSIS — M549 Dorsalgia, unspecified: Secondary | ICD-10-CM | POA: Insufficient documentation

## 2016-04-13 DIAGNOSIS — F329 Major depressive disorder, single episode, unspecified: Secondary | ICD-10-CM | POA: Insufficient documentation

## 2016-04-13 DIAGNOSIS — J449 Chronic obstructive pulmonary disease, unspecified: Secondary | ICD-10-CM | POA: Diagnosis not present

## 2016-04-13 NOTE — Progress Notes (Signed)
Subjective:  Patient ID: Lisa Pham Pham, female    DOB: 05/08/1956  Age: 60 y.o. MRN: [id number]  CC: Annual Exam   HPI Lisa Pham Eagen presents for a complete physical. Medical history is significant for hypertension, COPD, seasonal allergies, GERD, depression, migraines, hyperlipidemia   She has intermittent low back pain for Which she remains on gabapentin and has a TENS unit at home which she has not been using.  Past Medical History  Diagnosis Date  . Hypertension   . COPD (chronic obstructive pulmonary disease) (HCC)   . High cholesterol   . Heart murmur   . Myocardial infarction [facility](HCC) ~ 2011    "mild"  . Pneumonia ~ 2013  . B12 deficiency anemia   . GERD (gastroesophageal reflux disease)   . History of stomach ulcers   . Migraine     "3-4 times/wk or more" (05/14/2015)  . Chronic lower back pain   . Arthritis     "all over" (05/14/2015)  . Anxiety   . Depression   . Alcohol withdrawal [facility](HCC)      Outpatient Prescriptions Prior to Visit  Medication Sig Dispense Refill  . albuterol (PROVENTIL HFA;VENTOLIN HFA) 108 (90 Base) MCG/ACT inhaler Inhale 1-2 puffs into the lungs every 6 (six) hours as needed for wheezing or shortness of breath. 1 Inhaler 2  . amLODipine (NORVASC) 10 MG tablet Take 1 tablet (10 mg total) by mouth daily. 30 tablet 2  . aspirin-acetaminophen-caffeine (EXCEDRIN MIGRAINE) 250-250-65 MG per tablet Take 2 tablets by mouth every 6 (six) hours as needed for migraine.    Marland Kitchen. atorvastatin (LIPITOR) 40 MG tablet Take 1 tablet (40 mg total) by mouth daily. 30 tablet 2  . budesonide-formoterol (SYMBICORT) 160-4.5 MCG/ACT inhaler Inhale 2 puffs into the lungs 2 (two) times daily. 1 Inhaler 3  . fluticasone (FLONASE) 50 MCG/ACT nasal spray Place 1 spray into both nostrils daily. 16 g 1  . gabapentin (NEURONTIN) 600 MG tablet Take 1 tablet (600 mg total) by mouth 3 (three) times daily. 90 tablet 2  . losartan (COZAAR) 100 MG tablet Take 1 tablet (100 mg total) by  mouth daily. 30 tablet 2  . montelukast (SINGULAIR) 10 MG tablet Take 1 tablet (10 mg total) by mouth at bedtime. 30 tablet 2  . nystatin (MYCOSTATIN/NYSTOP) 100000 UNIT/GM POWD May use twice daily under breast 30 g 1  . pantoprazole (PROTONIX) 40 MG tablet Take 1 tablet (40 mg total) by mouth daily. 30 tablet 3  . tiotropium (SPIRIVA) 18 MCG inhalation capsule Place 1 capsule (18 mcg total) into inhaler and inhale daily. 30 capsule 2  . topiramate (TOPAMAX) 50 MG tablet Take 1 tablet (50 mg total) by mouth 2 (two) times daily. 60 tablet 2  . traZODone (DESYREL) 100 MG tablet Take 1 tablet (100 mg total) by mouth at bedtime as needed for sleep. 30 tablet 2  . venlafaxine (EFFEXOR) 25 MG tablet Take 1 tablet (25 mg total) by mouth 2 (two) times daily. 60 tablet 2  . vitamin B-12 (CYANOCOBALAMIN) 1000 MCG tablet Take 1,000 mcg by mouth daily.    . ranitidine (ZANTAC) 150 MG capsule Take 1 capsule (150 mg total) by mouth 2 (two) times daily. 60 capsule 0   No facility-administered medications prior to visit.    ROS Review of Systems  Constitutional: Negative for activity change, appetite change and fatigue.  HENT: Negative for congestion, sinus pressure and sore throat.   Eyes: Negative for visual disturbance.  Respiratory: Negative for cough,  chest tightness, shortness of breath and wheezing.   Cardiovascular: Negative for chest pain and palpitations.  Gastrointestinal: Negative for abdominal pain, constipation and abdominal distention.  Endocrine: Negative for polydipsia.  Genitourinary: Negative for dysuria and frequency.  Musculoskeletal: Positive for back pain (intermittent). Negative for arthralgias.  Skin: Negative for rash.  Neurological: Negative for tremors, light-headedness and numbness.  Hematological: Does not bruise/bleed easily.  Psychiatric/Behavioral: Negative for behavioral problems and agitation.    Objective:  BP 126/76 mmHg  Pulse 80  Temp(Src) 98.3 F (36.8 C)  (Oral)  Resp 16  Ht 5' 4.5" (1.638 m)  Wt 166 lb 9.6 oz (75.569 kg)  BMI 28.17 kg/m2  SpO2 97%  BP/Weight 04/13/2016 03/04/2016 12/11/2015  Systolic BP 126 143 185  Diastolic BP 76 81 103  Wt. (Lbs) 166.6 161 155.6  BMI 28.17 27.62 26.7      Physical Exam  Constitutional: She is oriented to person, place, and time. She appears well-developed and well-nourished. No distress.  HENT:  Head: Normocephalic.  Right Ear: External ear normal.  Left Ear: External ear normal.  Nose: Nose normal.  Mouth/Throat: Oropharynx is clear and moist.  Eyes: Conjunctivae and EOM are normal. Pupils are equal, round, and reactive to light.  Neck: Normal range of motion. No JVD present.  Cardiovascular: Normal rate, regular rhythm, normal heart sounds and intact distal pulses.  Exam reveals no gallop.   No murmur heard. Pulmonary/Chest: Effort normal and breath sounds normal. No respiratory distress. She has no wheezes. She has no rales. She exhibits no tenderness.  Abdominal: Soft. Bowel sounds are normal. She exhibits no distension and no mass. There is no tenderness.  Musculoskeletal: Normal range of motion. She exhibits no edema or tenderness.  Neurological: She is alert and oriented to person, place, and time. She has normal reflexes.  Skin: Skin is warm and dry. She is not diaphoretic.  Psychiatric: She has a normal mood and affect.     Assessment & Plan:   1. Need for hepatitis C screening test - Hepatitis C antibody screen  2. Need for Tdap vaccination - Tdap vaccine greater than or equal to 7yo IM  3. Special screening for malignant neoplasms, colon - Ambulatory referral to Gastroenterology  4. Screening for breast cancer - Mammogram Digital Screening; Future  5. Routine general medical examination at a health care facility No Pap smear indicated given history of hysterectomy. - HIV antibody  6. Back pain She remains on gabapentin which helped with back pain and was also  prescribed a TENS unit is well back in no acute, with consent. I have explained to her that if she needs a new TENS unit she would need to be referred to a rehabilitation physician.  No orders of the defined types were placed in this encounter.    Follow-up: Return in 3 months (on 07/14/2016) for follow up of chronic medical conditions.   Jaclyn ShaggyEnobong Amao MD

## 2016-04-13 NOTE — Patient Instructions (Addendum)
Health Maintenance, Female Adopting a healthy lifestyle and getting preventive care can go a long way to promote health and wellness. Talk with your health care provider about what schedule of regular examinations is right for you. This is a good chance for you to check in with your provider about disease prevention and staying healthy. In between checkups, there are plenty of things you can do on your own. Experts have done a lot of research about which lifestyle changes and preventive measures are most likely to keep you healthy. Ask your health care provider for more information. WEIGHT AND DIET  Eat a healthy diet  Be sure to include plenty of vegetables, fruits, low-fat dairy products, and lean protein.  Do not eat a lot of foods high in solid fats, added sugars, or salt.  Get regular exercise. This is one of the most important things you can do for your health.  Most adults should exercise for at least 150 minutes each week. The exercise should increase your heart rate and make you sweat (moderate-intensity exercise).  Most adults should also do strengthening exercises at least twice a week. This is in addition to the moderate-intensity exercise.  Maintain a healthy weight  Body mass index (BMI) is a measurement that can be used to identify possible weight problems. It estimates body fat based on height and weight. Your health care provider can help determine your BMI and help you achieve or maintain a healthy weight.  For females 62 years of age and older:   A BMI below 18.5 is considered underweight.  A BMI of 18.5 to 24.9 is normal.  A BMI of 25 to 29.9 is considered overweight.  A BMI of 30 and above is considered obese.  Watch levels of cholesterol and blood lipids  You should start having your blood tested for lipids and cholesterol at 62 years of age, then have this test every 5 years.  You may need to have your cholesterol levels checked more often if:  Your lipid  or cholesterol levels are high.  You are older than 62 years of age.  You are at high risk for heart disease.  CANCER SCREENING   Lung Cancer  Lung cancer screening is recommended for adults 62-66 years old who are at high risk for lung cancer because of a history of smoking.  A yearly low-dose CT scan of the lungs is recommended for people who:  Currently smoke.  Have quit within the past 15 years.  Have at least a 30-pack-year history of smoking. A pack year is smoking an average of one pack of cigarettes a day for 1 year.  Yearly screening should continue until it has been 15 years since you quit.  Yearly screening should stop if you develop a health problem that would prevent you from having lung cancer treatment.  Breast Cancer  Practice breast self-awareness. This means understanding how your breasts normally appear and feel.  It also means doing regular breast self-exams. Let your health care provider know about any changes, no matter how small.  If you are in your 20s or 30s, you should have a clinical breast exam (CBE) by a health care provider every 1-3 years as part of a regular health exam.  If you are 25 or older, have a CBE every year. Also consider having a breast X-ray (mammogram) every year.  If you have a family history of breast cancer, talk to your health care provider about genetic screening.  If you  are at high risk for breast cancer, talk to your health care provider about having an MRI and a mammogram every year.  Breast cancer gene (BRCA) assessment is recommended for women who have family members with BRCA-related cancers. BRCA-related cancers include:  Breast.  Ovarian.  Tubal.  Peritoneal cancers.  Results of the assessment will determine the need for genetic counseling and BRCA1 and BRCA2 testing. Cervical Cancer Your health care provider may recommend that you be screened regularly for cancer of the pelvic organs (ovaries, uterus, and  vagina). This screening involves a pelvic examination, including checking for microscopic changes to the surface of your cervix (Pap test). You may be encouraged to have this screening done every 3 years, beginning at age 21.  For women ages 30-65, health care providers may recommend pelvic exams and Pap testing every 3 years, or they may recommend the Pap and pelvic exam, combined with testing for human papilloma virus (HPV), every 5 years. Some types of HPV increase your risk of cervical cancer. Testing for HPV may also be done on women of any age with unclear Pap test results.  Other health care providers may not recommend any screening for nonpregnant women who are considered low risk for pelvic cancer and who do not have symptoms. Ask your health care provider if a screening pelvic exam is right for you.  If you have had past treatment for cervical cancer or a condition that could lead to cancer, you need Pap tests and screening for cancer for at least 20 years after your treatment. If Pap tests have been discontinued, your risk factors (such as having a new sexual partner) need to be reassessed to determine if screening should resume. Some women have medical problems that increase the chance of getting cervical cancer. In these cases, your health care provider may recommend more frequent screening and Pap tests. Colorectal Cancer  This type of cancer can be detected and often prevented.  Routine colorectal cancer screening usually begins at 62 years of age and continues through 62 years of age.  Your health care provider may recommend screening at an earlier age if you have risk factors for colon cancer.  Your health care provider may also recommend using home test kits to check for hidden blood in the stool.  A small camera at the end of a tube can be used to examine your colon directly (sigmoidoscopy or colonoscopy). This is done to check for the earliest forms of colorectal  cancer.  Routine screening usually begins at age 50.  Direct examination of the colon should be repeated every 5-10 years through 62 years of age. However, you may need to be screened more often if early forms of precancerous polyps or small growths are found. Skin Cancer  Check your skin from head to toe regularly.  Tell your health care provider about any new moles or changes in moles, especially if there is a change in a mole's shape or color.  Also tell your health care provider if you have a mole that is larger than the size of a pencil eraser.  Always use sunscreen. Apply sunscreen liberally and repeatedly throughout the day.  Protect yourself by wearing long sleeves, pants, a wide-brimmed hat, and sunglasses whenever you are outside. HEART DISEASE, DIABETES, AND HIGH BLOOD PRESSURE   High blood pressure causes heart disease and increases the risk of stroke. High blood pressure is more likely to develop in:  People who have blood pressure in the high end   of the normal range (130-139/85-89 mm Hg).  People who are overweight or obese.  People who are African American.  If you are 38-23 years of age, have your blood pressure checked every 3-5 years. If you are 61 years of age or older, have your blood pressure checked every year. You should have your blood pressure measured twice--once when you are at a hospital or clinic, and once when you are not at a hospital or clinic. Record the average of the two measurements. To check your blood pressure when you are not at a hospital or clinic, you can use:  An automated blood pressure machine at a pharmacy.  A home blood pressure monitor.  If you are between 45 years and 39 years old, ask your health care provider if you should take aspirin to prevent strokes.  Have regular diabetes screenings. This involves taking a blood sample to check your fasting blood sugar level.  If you are at a normal weight and have a low risk for diabetes,  have this test once every three years after 62 years of age.  If you are overweight and have a high risk for diabetes, consider being tested at a younger age or more often. PREVENTING INFECTION  Hepatitis B  If you have a higher risk for hepatitis B, you should be screened for this virus. You are considered at high risk for hepatitis B if:  You were born in a country where hepatitis B is common. Ask your health care provider which countries are considered high risk.  Your parents were born in a high-risk country, and you have not been immunized against hepatitis B (hepatitis B vaccine).  You have HIV or AIDS.  You use needles to inject street drugs.  You live with someone who has hepatitis B.  You have had sex with someone who has hepatitis B.  You get hemodialysis treatment.  You take certain medicines for conditions, including cancer, organ transplantation, and autoimmune conditions. Hepatitis C  Blood testing is recommended for:  Everyone born from [birth year] through 1967.  Anyone with known risk factors for hepatitis C. Sexually transmitted infections (STIs)  You should be screened for sexually transmitted infections (STIs) including gonorrhea and chlamydia if:  You are sexually active and are younger than 62 years of age.  You are older than 62 years of age and your health care provider tells you that you are at risk for this type of infection.  Your sexual activity has changed since you were last screened and you are at an increased risk for chlamydia or gonorrhea. Ask your health care provider if you are at risk.  If you do not have HIV, but are at risk, it may be recommended that you take a prescription medicine daily to prevent HIV infection. This is called pre-exposure prophylaxis (PrEP). You are considered at risk if:  You are sexually active and do not regularly use condoms or know the HIV status of your partner(s).  You take drugs by injection.  You are sexually  active with a partner who has HIV. Talk with your health care provider about whether you are at high risk of being infected with HIV. If you choose to begin PrEP, you should first be tested for HIV. You should then be tested every 3 months for as long as you are taking PrEP.  PREGNANCY   If you are premenopausal and you may become pregnant, ask your health care provider about preconception counseling.  If you may  become pregnant, take 400 to 800 micrograms (mcg) of folic acid every day.  If you want to prevent pregnancy, talk to your health care provider about birth control (contraception). OSTEOPOROSIS AND MENOPAUSE   Osteoporosis is a disease in which the bones lose minerals and strength with aging. This can result in serious bone fractures. Your risk for osteoporosis can be identified using a bone density scan.  If you are 65 years of age or older, or if you are at risk for osteoporosis and fractures, ask your health care provider if you should be screened.  Ask your health care provider whether you should take a calcium or vitamin D supplement to lower your risk for osteoporosis.  Menopause may have certain physical symptoms and risks.  Hormone replacement therapy may reduce some of these symptoms and risks. Talk to your health care provider about whether hormone replacement therapy is right for you.  HOME CARE INSTRUCTIONS   Schedule regular health, dental, and eye exams.  Stay current with your immunizations.   Do not use any tobacco products including cigarettes, chewing tobacco, or electronic cigarettes.  If you are pregnant, do not drink alcohol.  If you are breastfeeding, limit how much and how often you drink alcohol.  Limit alcohol intake to no more than 1 drink per day for nonpregnant women. One drink equals 12 ounces of beer, 5 ounces of wine, or 1 ounces of hard liquor.  Do not use street drugs.  Do not share needles.  Ask your health care provider for help if  you need support or information about quitting drugs.  Tell your health care provider if you often feel depressed.  Tell your health care provider if you have ever been abused or do not feel safe at home.   This information is not intended to replace advice given to you by your health care provider. Make sure you discuss any questions you have with your health care provider.   Document Released: 04/20/2011 Document Revised: 10/26/2014 Document Reviewed: 09/06/2013 Elsevier Interactive Patient Education 2016 Elsevier Inc[employer] Tdap Vaccine (Tetanus, Diphtheria and Pertussis): What You Need to Know 1. Why get vaccinated? Tetanus, diphtheria and pertussis are very serious diseases. Tdap vaccine can protect us from these diseases. And, Tdap vaccine given to pregnant women can protect newborn babies against pertussis. TETANUS (Lockjaw) is rare in the [place] today. It causes painful muscle tightening and stiffness, usually all over the body.  It can lead to tightening of muscles in the head and neck so you can't open your mouth, swallow, or sometimes even breathe. Tetanus kills about 1 out of 10 people who are infected even after receiving the best medical care. DIPHTHERIA is also rare in the [place] today. It can cause a thick coating to form in the back of the throat.  It can lead to breathing problems, heart failure, paralysis, and death. PERTUSSIS (Whooping Cough) causes severe coughing spells, which can cause difficulty breathing, vomiting and disturbed sleep.  It can also lead to weight loss, incontinence, and rib fractures. Up to 2 in 100 adolescents and 5 in 100 adults with pertussis are hospitalized or have complications, which could include pneumonia or death. These diseases are caused by bacteria. Diphtheria and pertussis are spread from person to person through secretions from coughing or sneezing. Tetanus enters the body through cuts, scratches, or wounds. Before vaccines, as  many as 200,000 cases of diphtheria, 200,000 cases of pertussis, and hundreds of cases of tetanus, were reported in   the [place] each year. Since vaccination began, reports of cases for tetanus and diphtheria have dropped by about 99% and for pertussis by about 80%. 2. Tdap vaccine Tdap vaccine can protect adolescents and adults from tetanus, diphtheria, and pertussis. One dose of Tdap is routinely given at age 11 or 12. People who did not get Tdap at that age should get it as soon as possible. Tdap is especially important for healthcare professionals and anyone having close contact with a baby younger than 12 months. Pregnant women should get a dose of Tdap during every pregnancy, to protect the newborn from pertussis. Infants are most at risk for severe, life-threatening complications from pertussis. Another vaccine, called Td, protects against tetanus and diphtheria, but not pertussis. A Td booster should be given every 10 years. Tdap may be given as one of these boosters if you have never gotten Tdap before. Tdap may also be given after a severe cut or burn to prevent tetanus infection. Your doctor or the person giving you the vaccine can give you more information. Tdap may safely be given at the same time as other vaccines. 3. Some people should not get this vaccine  A person who has ever had a life-threatening allergic reaction after a previous dose of any diphtheria, tetanus or pertussis containing vaccine, OR has a severe allergy to any part of this vaccine, should not get Tdap vaccine. Tell the person giving the vaccine about any severe allergies.  Anyone who had coma or long repeated seizures within 7 days after a childhood dose of DTP or DTaP, or a previous dose of Tdap, should not get Tdap, unless a cause other than the vaccine was found. They can still get Td.  Talk to your doctor if you:  have seizures or another nervous system problem,  had severe pain or swelling after any  vaccine containing diphtheria, tetanus or pertussis,  ever had a condition called Guillain-Barr Syndrome (GBS),  aren't feeling well on the day the shot is scheduled. 4. Risks With any medicine, including vaccines, there is a chance of side effects. These are usually mild and go away on their own. Serious reactions are also possible but are rare. Most people who get Tdap vaccine do not have any problems with it. Mild problems following Tdap (Did not interfere with activities)  Pain where the shot was given (about 3 in 4 adolescents or 2 in 3 adults)  Redness or swelling where the shot was given (about 1 person in 5)  Mild fever of at least 100.4F (up to about 1 in 25 adolescents or 1 in 100 adults)  Headache (about 3 or 4 people in 10)  Tiredness (about 1 person in 3 or 4)  Nausea, vomiting, diarrhea, stomach ache (up to 1 in 4 adolescents or 1 in 10 adults)  Chills, sore joints (about 1 person in 10)  Body aches (about 1 person in 3 or 4)  Rash, swollen glands (uncommon) Moderate problems following Tdap (Interfered with activities, but did not require medical attention)  Pain where the shot was given (up to 1 in 5 or 6)  Redness or swelling where the shot was given (up to about 1 in 16 adolescents or 1 in 12 adults)  Fever over 102F (about 1 in 100 adolescents or 1 in 250 adults)  Headache (about 1 in 7 adolescents or 1 in 10 adults)  Nausea, vomiting, diarrhea, stomach ache (up to 1 or 3 people in 100)  Swelling of the   entire arm where the shot was given (up to about 1 in 500). Severe problems following Tdap (Unable to perform usual activities; required medical attention)  Swelling, severe pain, bleeding and redness in the arm where the shot was given (rare). Problems that could happen after any vaccine:  People sometimes faint after a medical procedure, including vaccination. Sitting or lying down for about 15 minutes can help prevent fainting, and injuries  caused by a fall. Tell your doctor if you feel dizzy, or have vision changes or ringing in the ears.  Some people get severe pain in the shoulder and have difficulty moving the arm where a shot was given. This happens very rarely.  Any medication can cause a severe allergic reaction. Such reactions from a vaccine are very rare, estimated at fewer than 1 in a million doses, and would happen within a few minutes to a few hours after the vaccination. As with any medicine, there is a very remote chance of a vaccine causing a serious injury or death. The safety of vaccines is always being monitored. For more information, visit: www.cdc.gov/vaccinesafety/ 5. What if there is a serious problem? What should I look for?  Look for anything that concerns you, such as signs of a severe allergic reaction, very high fever, or unusual behavior.  Signs of a severe allergic reaction can include hives, swelling of the face and throat, difficulty breathing, a fast heartbeat, dizziness, and weakness. These would usually start a few minutes to a few hours after the vaccination. What should I do?  If you think it is a severe allergic reaction or other emergency that can't wait, call 9-1-1 or get the person to the nearest hospital. Otherwise, call your doctor.  Afterward, the reaction should be reported to the Vaccine Adverse Event Reporting System (VAERS). Your doctor might file this report, or you can do it yourself through the VAERS web site at www.vaers.hhs.gov, or by calling 1-800-822-7967. VAERS does not give medical advice.  6. The National Vaccine Injury Compensation Program The National Vaccine Injury Compensation Program (VICP) is a federal program that was created to compensate people who may have been injured by certain vaccines. Persons who believe they may have been injured by a vaccine can learn about the program and about filing a claim by calling 1-800-338-2382 or visiting the VICP website at  www.hrsa.gov/vaccinecompensation. There is a time limit to file a claim for compensation. 7. How can I learn more?  Ask your doctor. He or she can give you the vaccine package insert or suggest other sources of information.  Call your local or state health department.  Contact the Centers for Disease Control and Prevention (CDC):  Call 1-800-232-4636 (1-800-CDC-INFO) or  Visit CDC's website at www.cdc.gov/vaccines CDC Tdap Vaccine VIS (12/12/13)   This information is not intended to replace advice given to you by your health care provider. Make sure you discuss any questions you have with your health care provider.   Document Released: 04/05/2012 Document Revised: 10/26/2014 Document Reviewed: 01/17/2014 Elsevier Interactive Patient Education 2016 Elsevier Inc.  

## 2016-04-13 NOTE — Progress Notes (Signed)
Pt here for physical. Pt reports pain rated at a 7 and located on middle and lower back. Pt would like to discuss medication. Pt has not taken medications today. Pt also reports having a cough after taking her cholesterol medication.

## 2016-04-14 LAB — HIV ANTIBODY (ROUTINE TESTING W REFLEX): HIV 1&2 Ab, 4th Generation: NONREACTIVE

## 2016-04-14 LAB — HEPATITIS C ANTIBODY: HCV Ab: NEGATIVE

## 2016-04-16 ENCOUNTER — Encounter: Payer: Self-pay | Admitting: Gastroenterology

## 2016-04-17 ENCOUNTER — Telehealth: Payer: Self-pay

## 2016-04-17 NOTE — Telephone Encounter (Signed)
-----   Message from Jaclyn ShaggyEnobong Amao, MD sent at 04/15/2016  1:52 PM EDT ----- Please inform the patient that labs are normal. Thank you.

## 2016-04-17 NOTE — Telephone Encounter (Signed)
Writer called patient to discuss labs.  Patient was very distraught because her great grandchild passed away this week. Patient is requesting referral for a dentist and a opthalmologist.

## 2016-04-22 ENCOUNTER — Other Ambulatory Visit: Payer: Self-pay | Admitting: Family Medicine

## 2016-04-22 DIAGNOSIS — H527 Unspecified disorder of refraction: Secondary | ICD-10-CM

## 2016-04-22 DIAGNOSIS — K031 Abrasion of teeth: Secondary | ICD-10-CM

## 2016-04-22 NOTE — Progress Notes (Signed)
Referrals placed as per patient request.

## 2016-05-13 ENCOUNTER — Telehealth: Payer: Self-pay | Admitting: Family Medicine

## 2016-05-13 NOTE — Telephone Encounter (Signed)
Pt called states she has found her "electrical stimulation biomedical machine" Pt is also asking about food box, states she has not received a call. Please f/up

## 2016-05-21 ENCOUNTER — Other Ambulatory Visit: Payer: Self-pay | Admitting: Family Medicine

## 2016-05-21 ENCOUNTER — Encounter: Payer: Self-pay | Admitting: Family Medicine

## 2016-05-21 MED ORDER — LISINOPRIL 20 MG PO TABS: 20.0000 mg | ORAL_TABLET | Freq: Every day | ORAL | 3 refills | Status: DC

## 2016-05-21 NOTE — Progress Notes (Signed)
Switched from losartan to lisinopril as the former is not covered by insurance.

## 2016-05-28 ENCOUNTER — Telehealth: Payer: Self-pay | Admitting: Family Medicine

## 2016-05-28 NOTE — Telephone Encounter (Signed)
[facility] is [facility] using [facility]. [address] [place] [phone]

## 2016-05-28 NOTE — Telephone Encounter (Signed)
Routed message to [place] regarding the food box.

## 2016-05-28 NOTE — Telephone Encounter (Signed)
New pharmacy placed  In patient's chart.  [facility]- [address]  [place], [place]  [phone]

## 2016-06-12 ENCOUNTER — Telehealth: Payer: Self-pay | Admitting: Family Medicine

## 2016-06-12 NOTE — Telephone Encounter (Signed)
Stop lisinopril. If she is having angioedema she will need to go to the ED otherwise she can wait to schedule an appointment for a substitute antihypertensive.

## 2016-06-12 NOTE — Telephone Encounter (Signed)
Writer called patient who denies swelling or breathing difficulties.  She states that she is "very itchy" also denies hives or a rash, just a generalized itching. Patient asked to d/c the lisinopril but continue all other historical medications.  Patient states understanding. Scheduler to call patient with an appt.

## 2016-06-12 NOTE — Telephone Encounter (Signed)
Pt. Called requesting to speak with her nurse b/c she if having an allergic reaction to lisinopril. Pt. Stated she is breaking out and has not been felling good. Please f/u with pt. ASAP

## 2016-06-15 ENCOUNTER — Ambulatory Visit: Payer: Medicaid Other | Attending: Family Medicine | Admitting: Family Medicine

## 2016-06-15 ENCOUNTER — Other Ambulatory Visit: Payer: Self-pay

## 2016-06-15 ENCOUNTER — Encounter: Payer: Self-pay | Admitting: Family Medicine

## 2016-06-15 VITALS — BP 172/90 | HR 88 | Temp 98.6°F | Resp 16 | Ht 64.5 in | Wt 166.0 lb

## 2016-06-15 DIAGNOSIS — J302 Other seasonal allergic rhinitis: Secondary | ICD-10-CM | POA: Diagnosis not present

## 2016-06-15 DIAGNOSIS — F329 Major depressive disorder, single episode, unspecified: Secondary | ICD-10-CM | POA: Insufficient documentation

## 2016-06-15 DIAGNOSIS — Z9889 Other specified postprocedural states: Secondary | ICD-10-CM | POA: Insufficient documentation

## 2016-06-15 DIAGNOSIS — K219 Gastro-esophageal reflux disease without esophagitis: Secondary | ICD-10-CM | POA: Insufficient documentation

## 2016-06-15 DIAGNOSIS — F1010 Alcohol abuse, uncomplicated: Secondary | ICD-10-CM | POA: Diagnosis present

## 2016-06-15 DIAGNOSIS — T887XXA Unspecified adverse effect of drug or medicament, initial encounter: Secondary | ICD-10-CM

## 2016-06-15 DIAGNOSIS — E785 Hyperlipidemia, unspecified: Secondary | ICD-10-CM | POA: Insufficient documentation

## 2016-06-15 DIAGNOSIS — J449 Chronic obstructive pulmonary disease, unspecified: Secondary | ICD-10-CM | POA: Insufficient documentation

## 2016-06-15 DIAGNOSIS — Z79899 Other long term (current) drug therapy: Secondary | ICD-10-CM | POA: Diagnosis not present

## 2016-06-15 DIAGNOSIS — I10 Essential (primary) hypertension: Secondary | ICD-10-CM | POA: Diagnosis not present

## 2016-06-15 DIAGNOSIS — T50905A Adverse effect of unspecified drugs, medicaments and biological substances, initial encounter: Secondary | ICD-10-CM

## 2016-06-15 MED ORDER — CARVEDILOL 6.25 MG PO TABS: 6.2500 mg | ORAL_TABLET | Freq: Two times a day (BID) | ORAL | 3 refills | Status: DC

## 2016-06-15 MED ORDER — CARVEDILOL 6.25 MG PO TABS
6.2500 mg | ORAL_TABLET | Freq: Two times a day (BID) | ORAL | 3 refills | Status: DC
Start: 2016-06-15 — End: 2016-06-15

## 2016-06-15 NOTE — Progress Notes (Signed)
Patient here for rash started 06/08/16; thinks medication caused. Has not had BP med since ast Wednesday.  HA but not like typical Migraine.  Also has a question about old prescription "cyclobenzaprine".  Pollyann KennedyKim Becton, RN, BSN

## 2016-06-15 NOTE — Progress Notes (Signed)
Subjective:  Patient ID: Lisa Pham, female    DOB: 07/02/1956  Age: 60 y.o. MRN: [id number]  CC: Rash   HPI Lisa Pham is a 60 year old female with a history of hypertension, COPD, seasonal allergies, GERD, depression, migraines, hyperlipidemia who comes into the clinic complaining of a reaction to lisinopril. She was previously on losartan which was substituted with lisinopril due to failure of insurance company to cover the form. She commence lisinopril a week ago and noticed development of a rash around her neck which is pruritic and funny sensation in her throat but denies facial or lip edema. She has not taken any antihypertensive since last week hence elevated blood pressure.  She is an alcoholic and attributes this to being stressed and would like to receive help.  She has a bottle of cyclobenzaprine which was prescribed for back pain by her previous doctor [place] and is wondering if she could use this -I have educated her about fall risks associated with this in the elderly.  Past Medical History:  Diagnosis Date  . Alcohol withdrawal (HCC)   . Anxiety   . Arthritis    "all over" (05/14/2015)  . B12 deficiency anemia   . Chronic lower back pain   . COPD (chronic obstructive pulmonary disease) (HCC)   . Depression   . GERD (gastroesophageal reflux disease)   . Heart murmur   . High cholesterol   . History of stomach ulcers   . Hypertension   . Migraine    "3-4 times/wk or more" (05/14/2015)  . Myocardial infarction [facility](HCC) ~ 2011   "mild"  . Pneumonia ~ 2013    Past Surgical History:  Procedure Laterality Date  . ABDOMINAL HYSTERECTOMY    . EXCISIONAL HEMORRHOIDECTOMY  ~ 1970  . FRACTURE SURGERY    . THUMB FUSION Bilateral ~ 2010   "took something from wrist/forearm & put it in my thumb"  . TIBIA FRACTURE SURGERY Left ~ 2000  . TUBAL LIGATION       Outpatient Medications Prior to Visit  Medication Sig Dispense Refill  . amLODipine (NORVASC) 10 MG  tablet Take 1 tablet (10 mg total) by mouth daily. 30 tablet 2  . lisinopril (PRINIVIL,ZESTRIL) 20 MG tablet Take 1 tablet (20 mg total) by mouth daily. 30 tablet 3  . albuterol (PROVENTIL HFA;VENTOLIN HFA) 108 (90 Base) MCG/ACT inhaler Inhale 1-2 puffs into the lungs every 6 (six) hours as needed for wheezing or shortness of breath. 1 Inhaler 2  . aspirin-acetaminophen-caffeine (EXCEDRIN MIGRAINE) 250-250-65 MG per tablet Take 2 tablets by mouth every 6 (six) hours as needed for migraine.    Marland Kitchen. atorvastatin (LIPITOR) 40 MG tablet Take 1 tablet (40 mg total) by mouth daily. 30 tablet 2  . budesonide-formoterol (SYMBICORT) 160-4.5 MCG/ACT inhaler Inhale 2 puffs into the lungs 2 (two) times daily. 1 Inhaler 3  . fluticasone (FLONASE) 50 MCG/ACT nasal spray Place 1 spray into both nostrils daily. 16 g 1  . gabapentin (NEURONTIN) 600 MG tablet Take 1 tablet (600 mg total) by mouth 3 (three) times daily. 90 tablet 2  . montelukast (SINGULAIR) 10 MG tablet Take 1 tablet (10 mg total) by mouth at bedtime. 30 tablet 2  . nystatin (MYCOSTATIN/NYSTOP) 100000 UNIT/GM POWD May use twice daily under breast 30 g 1  . pantoprazole (PROTONIX) 40 MG tablet Take 1 tablet (40 mg total) by mouth daily. 30 tablet 3  . tiotropium (SPIRIVA) 18 MCG inhalation capsule Place 1 capsule (18 mcg total)  into inhaler and inhale daily. 30 capsule 2  . topiramate (TOPAMAX) 50 MG tablet Take 1 tablet (50 mg total) by mouth 2 (two) times daily. 60 tablet 2  . traZODone (DESYREL) 100 MG tablet Take 1 tablet (100 mg total) by mouth at bedtime as needed for sleep. 30 tablet 2  . venlafaxine (EFFEXOR) 25 MG tablet Take 1 tablet (25 mg total) by mouth 2 (two) times daily. 60 tablet 2  . vitamin B-12 (CYANOCOBALAMIN) 1000 MCG tablet Take 1,000 mcg by mouth daily.     No facility-administered medications prior to visit.     ROS Review of Systems  Constitutional: Negative for activity change, appetite change and fatigue.  HENT: Negative  for congestion, sinus pressure and sore throat.   Eyes: Negative for visual disturbance.  Respiratory: Negative for cough, chest tightness, shortness of breath and wheezing.   Cardiovascular: Negative for chest pain and palpitations.  Gastrointestinal: Negative for abdominal distention, abdominal pain and constipation.  Endocrine: Negative for polydipsia.  Genitourinary: Negative for dysuria and frequency.  Musculoskeletal: Negative for arthralgias and back pain.  Skin: Positive for rash.  Neurological: Negative for tremors, light-headedness and numbness.  Hematological: Does not bruise/bleed easily.  Psychiatric/Behavioral: Negative for agitation and behavioral problems.    Objective:  BP (!) 172/90 (BP Location: Right Arm, Patient Position: Sitting, Cuff Size: Normal)   Pulse 88   Temp 98.6 F (37 C) (Oral)   Resp 16   Ht 5' 4.5" (1.638 m)   Wt 166 lb (75.3 kg)   SpO2 97%   BMI 28.05 kg/m   BP/Weight 06/15/2016 04/13/2016 03/04/2016  Systolic BP 172 126 143  Diastolic BP 90 76 81  Wt. (Lbs) 166 166.6 161  BMI 28.05 28.17 27.62      Physical Exam  Constitutional: She is oriented to person, place, and time. She appears well-developed and well-nourished.  Cardiovascular: Normal rate, normal heart sounds and intact distal pulses.   No murmur heard. Pulmonary/Chest: Effort normal and breath sounds normal. She has no wheezes. She has no rales. She exhibits no tenderness.  Abdominal: Soft. Bowel sounds are normal. She exhibits no distension and no mass. There is no tenderness.  Musculoskeletal: Normal range of motion.  Neurological: She is alert and oriented to person, place, and time.  Skin:  Hypopigmented around the neck rash more on the left side of her neck     Assessment & Plan:   1. Alcohol abuse Referral for substance abuse counseling - Ambulatory referral to Psychiatry  2. Essential hypertension and ?drug reaction Uncontrolled due to not taking  antihypertensives I have discontinued lisinopril due to possible side effect Substituted with carvedilol. Reviewed ED precautions for allergic drug reaction  Meds ordered this encounter  Medications  . carvedilol (COREG) 6.25 MG tablet    Sig: Take 1 tablet (6.25 mg total) by mouth 2 (two) times daily with a meal.    Dispense:  60 tablet    Refill:  3    Discontinue Lisinopril    Follow-up: Return in about 2 weeks (around 06/29/2016), or if symptoms worsen or fail to improve, for follow up on drug reaction and Hypertension.   Jaclyn Shaggy MD

## 2016-06-24 ENCOUNTER — Ambulatory Visit (INDEPENDENT_AMBULATORY_CARE_PROVIDER_SITE_OTHER): Payer: Medicaid Other | Admitting: Gastroenterology

## 2016-06-24 ENCOUNTER — Other Ambulatory Visit (INDEPENDENT_AMBULATORY_CARE_PROVIDER_SITE_OTHER): Payer: Medicaid Other

## 2016-06-24 ENCOUNTER — Encounter: Payer: Self-pay | Admitting: Gastroenterology

## 2016-06-24 VITALS — BP 150/96 | HR 96 | Ht 63.25 in | Wt 165.1 lb

## 2016-06-24 DIAGNOSIS — F32A Depression, unspecified: Secondary | ICD-10-CM

## 2016-06-24 DIAGNOSIS — F1020 Alcohol dependence, uncomplicated: Secondary | ICD-10-CM | POA: Diagnosis not present

## 2016-06-24 DIAGNOSIS — F329 Major depressive disorder, single episode, unspecified: Secondary | ICD-10-CM

## 2016-06-24 DIAGNOSIS — K219 Gastro-esophageal reflux disease without esophagitis: Secondary | ICD-10-CM | POA: Diagnosis not present

## 2016-06-24 DIAGNOSIS — K5730 Diverticulosis of large intestine without perforation or abscess without bleeding: Secondary | ICD-10-CM | POA: Diagnosis not present

## 2016-06-24 LAB — CBC WITH DIFFERENTIAL/PLATELET
BASOS ABS: 0 10*3/uL (ref 0.0–0.1)
Basophils Relative: 0.9 % (ref 0.0–3.0)
Eosinophils Absolute: 0.1 10*3/uL (ref 0.0–0.7)
Eosinophils Relative: 2.6 % (ref 0.0–5.0)
HCT: 36.4 % (ref 36.0–46.0)
Hemoglobin: 12.6 g/dL (ref 12.0–15.0)
LYMPHS ABS: 2.5 10*3/uL (ref 0.7–4.0)
Lymphocytes Relative: 48.3 % — ABNORMAL HIGH (ref 12.0–46.0)
MCHC: 34.6 g/dL (ref 30.0–36.0)
MCV: 86.7 fl (ref 78.0–100.0)
MONO ABS: 0.4 10*3/uL (ref 0.1–1.0)
Monocytes Relative: 8.7 % (ref 3.0–12.0)
NEUTROS ABS: 2 10*3/uL (ref 1.4–7.7)
NEUTROS PCT: 39.5 % — AB (ref 43.0–77.0)
PLATELETS: 210 10*3/uL (ref 150.0–400.0)
RBC: 4.2 Mil/uL (ref 3.87–5.11)
RDW: 14.3 % (ref 11.5–15.5)
WBC: 5.2 10*3/uL (ref 4.0–10.5)

## 2016-06-24 LAB — COMPREHENSIVE METABOLIC PANEL
ALK PHOS: 94 U/L (ref 39–117)
ALT: 24 U/L (ref 0–35)
AST: 35 U/L (ref 0–37)
Albumin: 4.2 g/dL (ref 3.5–5.2)
BUN: 11 mg/dL (ref 6–23)
CO2: 27 meq/L (ref 19–32)
Calcium: 8.6 mg/dL (ref 8.4–10.5)
Chloride: 110 mEq/L (ref 96–112)
Creatinine, Ser: 0.69 mg/dL (ref 0.40–1.20)
GFR: 110.9 mL/min (ref >60.00)
GLUCOSE: 93 mg/dL (ref 70–99)
POTASSIUM: 3.2 meq/L — AB (ref 3.5–5.1)
SODIUM: 146 meq/L — AB (ref 135–145)
TOTAL PROTEIN: 6.9 g/dL (ref 6.0–8.3)
Total Bilirubin: 0.4 mg/dL (ref 0.2–1.2)

## 2016-06-24 MED ORDER — RANITIDINE HCL 150 MG PO TABS: 150.0000 mg | ORAL_TABLET | Freq: Every day | ORAL | 3 refills | Status: DC

## 2016-06-24 MED ORDER — DICYCLOMINE HCL 10 MG PO CAPS: 10.0000 mg | ORAL_CAPSULE | Freq: Three times a day (TID) | ORAL | 3 refills | Status: DC

## 2016-06-24 NOTE — Progress Notes (Signed)
HPI :  60 y/o female with a history of alcoholism / depression, arthritis, COPD, GERD, here for a new patient evaluation of reflux, also has a history of diverticulitis.   She has ongoing reflux symptoms, both pyrosis and regurgitation. She is currently not taking any medications for reflux. She reports she has been on a variety of PPIs in the past, she states she has had a variety of side effects in the past from them but difficult to clarify what it is (diarrhea, "gagging"). She specifically states protonix has caused her to have diarrhea. She takes alkaseltzer which can help. She has some rare dysphagia to large pills but otherwise no dysphagia at baseline. She states eating tomatoes makes things worse.   She reports being diagnosed with diverticulitis and was hospitalized last year for it. This was the first and only time she has had diverticulitis. She has been restricting her diet and she is afraid of eating things such as seeds or nuts to precipitate her symptoms, but she has a lot of anxiety about avoiding these types of foods. She reports having had a prior colonoscopy prior to moving to [facility], April / May of 2015. This was done in [place]. She is not sure of the results but thinks she has had a history of polyps. Her stool frequency can vary and fluctuate, no blood in the stools. She has some periodic lower abdominal cramps which can bother her, relieved with a bowel movement.  She reports she drinks a significant amount of alcohol daily- wine and beer, she states she "drinks until she goes to sleep" but cannot clarify how much she drinks. She smokes cigarettes. She has ongoing issues with depression and was quite tearful in the visit today. She denies SI.Marland Kitchen She is not taking her Effexor for this issue.   EGD 04/25/14 - performed by Dr. Jeanett Schlein, pictures only, no report available Colonoscopy done in 2015 - no report available   Past Medical History:  Diagnosis Date  . Alcohol  withdrawal (HCC)   . Alcoholism (HCC)   . Anxiety   . Arthritis    "all over" (05/14/2015)  . Asthma   . B12 deficiency anemia   . Chronic lower back pain   . Colon polyps   . COPD (chronic obstructive pulmonary disease) (HCC)   . Depression   . Diverticulosis   . Gastric ulcer   . GERD (gastroesophageal reflux disease)   . Heart murmur   . High cholesterol   . History of stomach ulcers   . Hyperparathyroidism (HCC)   . Hypertension   . Migraine    "3-4 times/wk or more" (05/14/2015)  . Myocardial infarction [facility]) ~ 2011   "mild"  . Pancreatitis   . Pneumonia ~ 2013     Past Surgical History:  Procedure Laterality Date  . ABDOMINAL HYSTERECTOMY    . EXCISIONAL HEMORRHOIDECTOMY  ~ 1970  . FRACTURE SURGERY    . THUMB FUSION Bilateral ~ 2010   "took something from wrist/forearm & put it in my thumb"  . TIBIA FRACTURE SURGERY Left ~ 2000  . TUBAL LIGATION     Family History  Problem Relation Age of Onset  . Cancer Mother     mets  . Cirrhosis Father   . Colon cancer Paternal Grandfather    Social History  Substance Use Topics  . Smoking status: Current Every Day Smoker    Packs/day: 0.25    Years: 45.00    Types: Cigarettes  .  Smokeless tobacco: Never Used     Comment: "allergic to nicotine patch"  . Alcohol use Yes     Comment: 05/22/15-no ETOF since hospitalization 05/14/2015 "II'm an alcoholic; I may drink 1, 12 pack/wk; sometimes more when I want it; might drink a 6 pack of wine coolers but I don't drink beer w/that; ; get shaky after a couple days if I don't drink"   Current Outpatient Prescriptions  Medication Sig Dispense Refill  . albuterol (PROVENTIL HFA;VENTOLIN HFA) 108 (90 Base) MCG/ACT inhaler Inhale 1-2 puffs into the lungs every 6 (six) hours as needed for wheezing or shortness of breath. 1 Inhaler 2  . amLODipine (NORVASC) 10 MG tablet Take 1 tablet (10 mg total) by mouth daily. 30 tablet 2  . aspirin-acetaminophen-caffeine (EXCEDRIN MIGRAINE)  250-250-65 MG per tablet Take 2 tablets by mouth every 6 (six) hours as needed for migraine.    Marland Kitchen. atorvastatin (LIPITOR) 40 MG tablet Take 1 tablet (40 mg total) by mouth daily. 30 tablet 2  . budesonide-formoterol (SYMBICORT) 160-4.5 MCG/ACT inhaler Inhale 2 puffs into the lungs 2 (two) times daily. 1 Inhaler 3  . fluticasone (FLONASE) 50 MCG/ACT nasal spray Place 1 spray into both nostrils daily. 16 g 1  . gabapentin (NEURONTIN) 600 MG tablet Take 1 tablet (600 mg total) by mouth 3 (three) times daily. 90 tablet 2  . montelukast (SINGULAIR) 10 MG tablet Take 1 tablet (10 mg total) by mouth at bedtime. 30 tablet 2  . nystatin (MYCOSTATIN/NYSTOP) 100000 UNIT/GM POWD May use twice daily under breast 30 g 1  . tiotropium (SPIRIVA) 18 MCG inhalation capsule Place 1 capsule (18 mcg total) into inhaler and inhale daily. 30 capsule 2  . topiramate (TOPAMAX) 50 MG tablet Take 1 tablet (50 mg total) by mouth 2 (two) times daily. 60 tablet 2  . traZODone (DESYREL) 100 MG tablet Take 1 tablet (100 mg total) by mouth at bedtime as needed for sleep. 30 tablet 2  . vitamin B-12 (CYANOCOBALAMIN) 1000 MCG tablet Take 1,000 mcg by mouth daily.    Marland Kitchen. dicyclomine (BENTYL) 10 MG capsule Take 1-2 capsules (10-20 mg total) by mouth every 8 (eight) hours. 60 capsule 3  . pantoprazole (PROTONIX) 40 MG tablet Take 1 tablet (40 mg total) by mouth daily. (Patient not taking: Reported on 06/24/2016) 30 tablet 3  . ranitidine (ZANTAC) 150 MG tablet Take 1-2 tablets (150-300 mg total) by mouth daily. 90 tablet 3  . venlafaxine (EFFEXOR) 25 MG tablet Take 1 tablet (25 mg total) by mouth 2 (two) times daily. (Patient not taking: Reported on 06/24/2016) 60 tablet 2   No current facility-administered medications for this visit.    Allergies  Allergen Reactions  . Coreg [Carvedilol]   . Latex Hives and Itching  . Penicillins Other (See Comments)    Yeast infection  . Nicotine Rash    The adhesive in nicotine patch causes her to  break out.     Review of Systems: All systems reviewed and negative except where noted in HPI.    No results found.  Physical Exam: BP (!) 150/96 (BP Location: Left Arm, Patient Position: Sitting, Cuff Size: Normal)   Pulse 96   Ht 5' 3.25" (1.607 m) Comment: height measured without shoes  Wt 165 lb 2 oz (74.9 kg)   BMI 29.02 kg/m  Constitutional: Pleasant,well-developed, female in no acute distress. HEENT: Normocephalic and atraumatic. Conjunctivae are normal. No scleral icterus. Neck supple.  Cardiovascular: Normal rate, regular rhythm.  Pulmonary/chest: Effort  normal and breath sounds normal. No wheezing, rales or rhonchi. Abdominal: Soft, nondistended, nontender. Bowel sounds active throughout. There are no masses palpable. No hepatomegaly. Extremities: no edema Lymphadenopathy: No cervical adenopathy noted. Neurological: Alert and oriented to person place and time. Skin: Skin is warm and dry. No rashes noted. Psychiatric: Normal mood and affect. Behavior is normal.   ASSESSMENT AND PLAN: 60 y/o female with PMH as outlined above, assessed for the following issues today:  GERD - longstanding symptoms, reportedly with EGD without Barrett's in 2015 but no report available. Will obtain formal report to confirm this. Otherwise she endorses diarrhea and multiple other side effects of PPIs, but not using anything at present and having significant reflux symptoms. I offered her a trial of Zantac to use BID and see if this helps. She can use Gaviscon PRN otherwise, follow up if no improvement.   Diverticulosis - history of diverticulitis. Overall only one episode. She has been on a strict diet to avoid seeds and nuts, causing her anxiety. I discussed more recent data which shows avoiding seeds / nuts is not necessary and eating them should not increase her risk for diverticulitis. She should liberate her diet at this time and monitor symptoms. Daily fiber supplement would be good for  this and she can take bentyl PRN for cramps. I otherwise asked for formal report of her colonoscopy to clarify findings and determine when she is next due.   Alcoholism / depression - I think her depression is driving alcohol use and we discussed this. She is quite tearful. I counseled her on long term risks of alcohol use and recommend she seek treatment for abstinence and detox. I provided a consult to her for behavioral health for this and her depression, but she must call to schedule the appointment. I otherwise will obtain baseline labs to ensure normal given alcohol use. She agreed.   Ileene Patrick, MD [facility] Pager [phone]  CC: Jaclyn Shaggy, MD   Addendum: patient felt lightheaded after blood draw in the lab, we were called, BP noted to be in 200s /100s. She was brought back up to clinic and monitored. We repeated vitals and BP returned to previous normal. She was mentating normally and sent home from the clinic in stable condition. Suspect she had a reaction due to blood draw and appeared at baseline once she returned to the clinic. She should monitor BP at home and follow up with [facility] regarding this.

## 2016-06-24 NOTE — Patient Instructions (Signed)
We have sent the following medications to your pharmacy for you to pick up at your convenience:  Zantac, Bentyl.  You have been given some information on Behavioral Health so you can call and make an appointment

## 2016-06-25 NOTE — Telephone Encounter (Signed)
Pt. Called stating that she went to an office visit yesterday and her BP was 150/96.  Pt. States that her BP has been high. Please f/u with pt.

## 2016-06-26 NOTE — Telephone Encounter (Signed)
She needs to keep her follow-up appointment with me.

## 2016-06-29 NOTE — Telephone Encounter (Signed)
Writer called patient this morning regarding her high BP reading.  Writer woke patient up so she was really wanting to discuss.  Patient will call back today per Dr. Venetia NightAmao to schedule an appt .

## 2016-07-02 ENCOUNTER — Other Ambulatory Visit: Payer: Self-pay | Admitting: Family Medicine

## 2016-07-02 DIAGNOSIS — E876 Hypokalemia: Secondary | ICD-10-CM

## 2016-07-03 NOTE — Progress Notes (Signed)
Patient to come in on the 07/13/16 for a f/u with Dr, Venetia NightAmao.  She needs a BMP to check her potassium level at that time which was low at 3.2 from 06/24/16.

## 2016-07-13 ENCOUNTER — Ambulatory Visit: Payer: Medicaid Other | Attending: Family Medicine | Admitting: Family Medicine

## 2016-07-13 ENCOUNTER — Encounter: Payer: Self-pay | Admitting: Family Medicine

## 2016-07-13 VITALS — BP 131/74 | HR 77 | Temp 98.3°F | Ht 63.75 in | Wt 162.8 lb

## 2016-07-13 DIAGNOSIS — K219 Gastro-esophageal reflux disease without esophagitis: Secondary | ICD-10-CM | POA: Insufficient documentation

## 2016-07-13 DIAGNOSIS — F329 Major depressive disorder, single episode, unspecified: Secondary | ICD-10-CM

## 2016-07-13 DIAGNOSIS — J449 Chronic obstructive pulmonary disease, unspecified: Secondary | ICD-10-CM | POA: Insufficient documentation

## 2016-07-13 DIAGNOSIS — G8929 Other chronic pain: Secondary | ICD-10-CM | POA: Insufficient documentation

## 2016-07-13 DIAGNOSIS — F419 Anxiety disorder, unspecified: Secondary | ICD-10-CM | POA: Insufficient documentation

## 2016-07-13 DIAGNOSIS — E876 Hypokalemia: Secondary | ICD-10-CM | POA: Diagnosis not present

## 2016-07-13 DIAGNOSIS — M545 Low back pain: Secondary | ICD-10-CM | POA: Insufficient documentation

## 2016-07-13 DIAGNOSIS — F1010 Alcohol abuse, uncomplicated: Secondary | ICD-10-CM | POA: Insufficient documentation

## 2016-07-13 DIAGNOSIS — E785 Hyperlipidemia, unspecified: Secondary | ICD-10-CM | POA: Diagnosis not present

## 2016-07-13 DIAGNOSIS — Z23 Encounter for immunization: Secondary | ICD-10-CM

## 2016-07-13 DIAGNOSIS — F32A Depression, unspecified: Secondary | ICD-10-CM

## 2016-07-13 DIAGNOSIS — I10 Essential (primary) hypertension: Secondary | ICD-10-CM | POA: Insufficient documentation

## 2016-07-13 DIAGNOSIS — Z79899 Other long term (current) drug therapy: Secondary | ICD-10-CM | POA: Insufficient documentation

## 2016-07-13 LAB — BASIC METABOLIC PANEL
BUN: 8 mg/dL (ref 7–25)
CALCIUM: 9.4 mg/dL (ref 8.6–10.4)
CO2: 25 mmol/L (ref 20–31)
Chloride: 109 mmol/L (ref 98–110)
Creat: 0.64 mg/dL (ref 0.50–0.99)
GLUCOSE: 104 mg/dL — AB (ref 65–99)
POTASSIUM: 3.1 mmol/L — AB (ref 3.5–5.3)
SODIUM: 144 mmol/L (ref 135–146)

## 2016-07-13 NOTE — Patient Instructions (Signed)

## 2016-07-13 NOTE — Progress Notes (Signed)
Moving and is very happy

## 2016-07-13 NOTE — Progress Notes (Signed)
Subjective:  Patient ID: Lisa Pham, female    DOB: 04/26/1956  Age: 60 y.o. MRN: [id number]  CC: Hypertension; Anxiety; and Depression   HPI Lisa Pham is a 60 year old female with a history of hypertension, COPD, seasonal allergies, GERD, depression, migraines, hyperlipidemia who comes into the clinic for a follow up visit.  Due to a rash she developed on Lisinopril she was placed on carvedilol at her last office visit to be continued with amlodipine however she never picked up her carvedilol. At her visit with GI carvedilol was was miscategorized as an allergy instead of the Lisinopril and she never got to take the carvedilol. Her blood pressure is controlled today despite taking only amlodipine.  She continues to have low back pain which radiates to her right butt cheek and is requesting prescription for pads for her TENS unit. Moving and heavy lifting has exacerbated her low back pain to 7/10.  She never went for alcohol abuse counseling due to difficulty with transportation and is working on quitting herself; she is hoping that with the milk to her new apartment that will be decreased need for alcohol consumption has had a pressure will be better controlled. Right now she is not taking her Effexor for depression because she is actively drinking.  Outpatient Medications Prior to Visit  Medication Sig Dispense Refill  . albuterol (PROVENTIL HFA;VENTOLIN HFA) 108 (90 Base) MCG/ACT inhaler Inhale 1-2 puffs into the lungs every 6 (six) hours as needed for wheezing or shortness of breath. 1 Inhaler 2  . amLODipine (NORVASC) 10 MG tablet Take 1 tablet (10 mg total) by mouth daily. 30 tablet 2  . aspirin-acetaminophen-caffeine (EXCEDRIN MIGRAINE) 250-250-65 MG per tablet Take 2 tablets by mouth every 6 (six) hours as needed for migraine.    Marland Kitchen atorvastatin (LIPITOR) 40 MG tablet Take 1 tablet (40 mg total) by mouth daily. 30 tablet 2  . budesonide-formoterol (SYMBICORT) 160-4.5 MCG/ACT  inhaler Inhale 2 puffs into the lungs 2 (two) times daily. 1 Inhaler 3  . fluticasone (FLONASE) 50 MCG/ACT nasal spray Place 1 spray into both nostrils daily. 16 g 1  . gabapentin (NEURONTIN) 600 MG tablet Take 1 tablet (600 mg total) by mouth 3 (three) times daily. 90 tablet 2  . montelukast (SINGULAIR) 10 MG tablet Take 1 tablet (10 mg total) by mouth at bedtime. 30 tablet 2  . nystatin (MYCOSTATIN/NYSTOP) 100000 UNIT/GM POWD May use twice daily under breast 30 g 1  . tiotropium (SPIRIVA) 18 MCG inhalation capsule Place 1 capsule (18 mcg total) into inhaler and inhale daily. 30 capsule 2  . topiramate (TOPAMAX) 50 MG tablet Take 1 tablet (50 mg total) by mouth 2 (two) times daily. 60 tablet 2  . traZODone (DESYREL) 100 MG tablet Take 1 tablet (100 mg total) by mouth at bedtime as needed for sleep. 30 tablet 2  . dicyclomine (BENTYL) 10 MG capsule Take 1-2 capsules (10-20 mg total) by mouth every 8 (eight) hours. (Patient not taking: Reported on 07/13/2016) 60 capsule 3  . pantoprazole (PROTONIX) 40 MG tablet Take 1 tablet (40 mg total) by mouth daily. (Patient not taking: Reported on 07/13/2016) 30 tablet 3  . ranitidine (ZANTAC) 150 MG tablet Take 1-2 tablets (150-300 mg total) by mouth daily. (Patient not taking: Reported on 07/13/2016) 90 tablet 3  . venlafaxine (EFFEXOR) 25 MG tablet Take 1 tablet (25 mg total) by mouth 2 (two) times daily. (Patient not taking: Reported on 07/13/2016) 60 tablet 2  . vitamin B-12 (CYANOCOBALAMIN)  1000 MCG tablet Take 1,000 mcg by mouth daily.     No facility-administered medications prior to visit.     ROS Review of Systems  Constitutional: Negative for activity change, appetite change and fatigue.  HENT: Negative for congestion, sinus pressure and sore throat.   Eyes: Negative for visual disturbance.  Respiratory: Negative for cough, chest tightness, shortness of breath and wheezing.   Cardiovascular: Negative for chest pain and palpitations.    Gastrointestinal: Negative for abdominal distention, abdominal pain and constipation.  Endocrine: Negative for polydipsia.  Genitourinary: Negative for dysuria and frequency.  Musculoskeletal: Positive for back pain. Negative for arthralgias.  Skin: Negative for rash.  Neurological: Negative for tremors, light-headedness and numbness.  Hematological: Does not bruise/bleed easily.  Psychiatric/Behavioral: Negative for agitation and behavioral problems.    Objective:  BP 131/74 (BP Location: Right Arm, Patient Position: Sitting, Cuff Size: Large)   Pulse 77   Temp 98.3 F (36.8 C) (Oral)   Ht 5' 3.75" (1.619 m)   Wt 162 lb 12.8 oz (73.8 kg)   SpO2 100%   BMI 28.16 kg/m   BP/Weight 07/13/2016 06/24/2016 06/15/2016  Systolic BP 131 150 172  Diastolic BP 74 96 90  Wt. (Lbs) 162.8 165.13 166  BMI 28.16 29.02 28.05      Physical Exam  Constitutional: She is oriented to person, place, and time. She appears well-developed and well-nourished.  Cardiovascular: Normal rate, normal heart sounds and intact distal pulses.   No murmur heard. Pulmonary/Chest: Effort normal and breath sounds normal. She has no wheezes. She has no rales. She exhibits no tenderness.  Abdominal: Soft. Bowel sounds are normal. She exhibits no distension and no mass. There is no tenderness.  Musculoskeletal: She exhibits tenderness (lumbar tenderness to palpation. Positive straight leg raise on the right).  Neurological: She is alert and oriented to person, place, and time.     Assessment & Plan:   1. Hypokalemia Last of blood work revealed hypokalemia Repeat basic metabolic panel - Basic Metabolic Panel  2. Alcohol abuse She is currently working on cessation Never followed through with behavioral health referral for alcohol cessation counselling  3. Depression Would love to place on Cymbalta which would help her depression and pain however patient was to hold off to see if movement to new apartment will  help symptoms.  4. Essential hypertension Controlled  5. Chronic lower back pain Tylenol extra strength as needed   No orders of the defined types were placed in this encounter.   Follow-up: Return in about 6 weeks (around 08/24/2016) for Follow-up of hypokalemia.   Jaclyn ShaggyEnobong Amao MD

## 2016-07-14 ENCOUNTER — Other Ambulatory Visit: Payer: Self-pay | Admitting: Family Medicine

## 2016-07-14 DIAGNOSIS — E876 Hypokalemia: Secondary | ICD-10-CM

## 2016-07-14 MED ORDER — POTASSIUM CHLORIDE CRYS ER 20 MEQ PO TBCR: 20.0000 meq | EXTENDED_RELEASE_TABLET | Freq: Every day | ORAL | 3 refills | Status: DC

## 2016-07-15 ENCOUNTER — Other Ambulatory Visit: Payer: Self-pay | Admitting: Family Medicine

## 2016-07-15 DIAGNOSIS — G43009 Migraine without aura, not intractable, without status migrainosus: Secondary | ICD-10-CM

## 2016-07-15 DIAGNOSIS — I10 Essential (primary) hypertension: Secondary | ICD-10-CM

## 2016-07-16 ENCOUNTER — Telehealth: Payer: Self-pay

## 2016-07-16 NOTE — Telephone Encounter (Signed)
Patient called with lab results per Dr. Venetia NightAmao.  Patient states understanding of her need to start potassium and states that [facility] will [facility] today.

## 2016-07-16 NOTE — Telephone Encounter (Signed)
-----   Message from Jaclyn ShaggyEnobong Amao, MD sent at 07/14/2016  9:20 AM EDT ----- Potassium is still low. I have placed her on potassium pills.

## 2016-08-05 NOTE — Progress Notes (Signed)
Terri L Slaugenhaupt, CCC-SLP  Jeanine LuzLeslie K Chirstine Defrain, CMA        FYI: We called your patient, Lisa CornwallLinda Pham MRN: [id number] and she has declined to schedule an appointment with us, at this time.   We appreciate the referral.

## 2016-08-18 ENCOUNTER — Other Ambulatory Visit: Payer: Self-pay | Admitting: Family Medicine

## 2016-08-18 ENCOUNTER — Other Ambulatory Visit: Payer: Self-pay | Admitting: Gastroenterology

## 2016-08-18 DIAGNOSIS — J302 Other seasonal allergic rhinitis: Secondary | ICD-10-CM

## 2016-08-18 DIAGNOSIS — E876 Hypokalemia: Secondary | ICD-10-CM

## 2016-08-18 DIAGNOSIS — E785 Hyperlipidemia, unspecified: Secondary | ICD-10-CM

## 2016-09-16 ENCOUNTER — Ambulatory Visit: Payer: Self-pay | Admitting: Family Medicine

## 2016-09-18 ENCOUNTER — Telehealth: Payer: Self-pay | Admitting: Family Medicine

## 2016-09-18 DIAGNOSIS — I10 Essential (primary) hypertension: Secondary | ICD-10-CM

## 2016-09-18 MED ORDER — AMLODIPINE BESYLATE 10 MG PO TABS: 10.0000 mg | ORAL_TABLET | Freq: Every day | ORAL | 2 refills | Status: DC

## 2016-09-18 NOTE — Telephone Encounter (Signed)
Amlodipine refilled.

## 2016-09-18 NOTE — Telephone Encounter (Signed)
Patient is needing as refill for amlodipine[ine

## 2016-09-24 ENCOUNTER — Ambulatory Visit: Payer: Medicaid Other | Attending: Family Medicine | Admitting: Family Medicine

## 2016-09-24 ENCOUNTER — Encounter: Payer: Self-pay | Admitting: Family Medicine

## 2016-09-24 DIAGNOSIS — E876 Hypokalemia: Secondary | ICD-10-CM | POA: Diagnosis not present

## 2016-09-24 DIAGNOSIS — Z79899 Other long term (current) drug therapy: Secondary | ICD-10-CM | POA: Insufficient documentation

## 2016-09-24 DIAGNOSIS — Z91040 Latex allergy status: Secondary | ICD-10-CM | POA: Insufficient documentation

## 2016-09-24 DIAGNOSIS — Z888 Allergy status to other drugs, medicaments and biological substances status: Secondary | ICD-10-CM | POA: Diagnosis not present

## 2016-09-24 DIAGNOSIS — J302 Other seasonal allergic rhinitis: Secondary | ICD-10-CM

## 2016-09-24 DIAGNOSIS — F339 Major depressive disorder, recurrent, unspecified: Secondary | ICD-10-CM | POA: Insufficient documentation

## 2016-09-24 DIAGNOSIS — I10 Essential (primary) hypertension: Secondary | ICD-10-CM | POA: Diagnosis not present

## 2016-09-24 DIAGNOSIS — K219 Gastro-esophageal reflux disease without esophagitis: Secondary | ICD-10-CM

## 2016-09-24 DIAGNOSIS — J449 Chronic obstructive pulmonary disease, unspecified: Secondary | ICD-10-CM | POA: Insufficient documentation

## 2016-09-24 DIAGNOSIS — G43009 Migraine without aura, not intractable, without status migrainosus: Secondary | ICD-10-CM | POA: Insufficient documentation

## 2016-09-24 DIAGNOSIS — J439 Emphysema, unspecified: Secondary | ICD-10-CM | POA: Diagnosis not present

## 2016-09-24 DIAGNOSIS — F330 Major depressive disorder, recurrent, mild: Secondary | ICD-10-CM

## 2016-09-24 DIAGNOSIS — Z880 Allergy status to penicillin: Secondary | ICD-10-CM | POA: Diagnosis not present

## 2016-09-24 LAB — BASIC METABOLIC PANEL
BUN: 6 mg/dL — AB (ref 7–25)
CALCIUM: 9.8 mg/dL (ref 8.6–10.4)
CHLORIDE: 105 mmol/L (ref 98–110)
CO2: 25 mmol/L (ref 20–31)
CREATININE: 0.7 mg/dL (ref 0.50–0.99)
GLUCOSE: 97 mg/dL (ref 65–99)
Potassium: 3.8 mmol/L (ref 3.5–5.3)
Sodium: 143 mmol/L (ref 135–146)

## 2016-09-24 MED ORDER — POTASSIUM CHLORIDE CRYS ER 20 MEQ PO TBCR: 20.0000 meq | EXTENDED_RELEASE_TABLET | Freq: Every day | ORAL | 3 refills | Status: DC

## 2016-09-24 MED ORDER — TRAZODONE HCL 100 MG PO TABS: 100.0000 mg | ORAL_TABLET | Freq: Every evening | ORAL | 2 refills | Status: DC | PRN

## 2016-09-24 MED ORDER — TOPIRAMATE 50 MG PO TABS: 50.0000 mg | ORAL_TABLET | Freq: Two times a day (BID) | ORAL | 2 refills | Status: DC

## 2016-09-24 MED ORDER — TIOTROPIUM BROMIDE MONOHYDRATE 18 MCG IN CAPS: 18.0000 ug | ORAL_CAPSULE | Freq: Every day | RESPIRATORY_TRACT | 2 refills | Status: DC

## 2016-09-24 MED ORDER — ALBUTEROL SULFATE HFA 108 (90 BASE) MCG/ACT IN AERS: 1.0000 | INHALATION_SPRAY | Freq: Four times a day (QID) | RESPIRATORY_TRACT | 2 refills | Status: DC | PRN

## 2016-09-24 MED ORDER — AMLODIPINE BESYLATE 10 MG PO TABS: 10.0000 mg | ORAL_TABLET | Freq: Every day | ORAL | 2 refills | Status: DC

## 2016-09-24 MED ORDER — MONTELUKAST SODIUM 10 MG PO TABS: 10.0000 mg | ORAL_TABLET | Freq: Every day | ORAL | 2 refills | Status: DC

## 2016-09-24 MED ORDER — VENLAFAXINE HCL 25 MG PO TABS: 25.0000 mg | ORAL_TABLET | Freq: Two times a day (BID) | ORAL | 2 refills | Status: DC

## 2016-09-24 MED ORDER — PANTOPRAZOLE SODIUM 40 MG PO TBEC: 40.0000 mg | DELAYED_RELEASE_TABLET | Freq: Every day | ORAL | 3 refills | Status: AC

## 2016-09-24 MED ORDER — BUDESONIDE-FORMOTEROL FUMARATE 160-4.5 MCG/ACT IN AERO: 2.0000 | INHALATION_SPRAY | Freq: Two times a day (BID) | RESPIRATORY_TRACT | 3 refills | Status: DC

## 2016-09-24 MED ORDER — GABAPENTIN 600 MG PO TABS: 600.0000 mg | ORAL_TABLET | Freq: Three times a day (TID) | ORAL | 2 refills | Status: DC

## 2016-09-24 NOTE — Patient Instructions (Signed)
Hypertension Hypertension, commonly called high blood pressure, is when the force of blood pumping through your arteries is too strong. Your arteries are the blood vessels that carry blood from your heart throughout your body. A blood pressure reading consists of a higher number over a lower number, such as 110/72. The higher number (systolic) is the pressure inside your arteries when your heart pumps. The lower number (diastolic) is the pressure inside your arteries when your heart relaxes. Ideally you want your blood pressure below 120/80. Hypertension forces your heart to work harder to pump blood. Your arteries may become narrow or stiff. Having untreated or uncontrolled hypertension can cause heart attack, stroke, kidney disease, and other problems. What increases the risk? Some risk factors for high blood pressure are controllable. Others are not. Risk factors you cannot control include:  Race. You may be at higher risk if you are African American.  Age. Risk increases with age.  Gender. Men are at higher risk than women before age 45 years. After age 65, women are at higher risk than men. Risk factors you can control include:  Not getting enough exercise or physical activity.  Being overweight.  Getting too much fat, sugar, calories, or salt in your diet.  Drinking too much alcohol. What are the signs or symptoms? Hypertension does not usually cause signs or symptoms. Extremely high blood pressure (hypertensive crisis) may cause headache, anxiety, shortness of breath, and nosebleed. How is this diagnosed? To check if you have hypertension, your health care provider will measure your blood pressure while you are seated, with your arm held at the level of your heart. It should be measured at least twice using the same arm. Certain conditions can cause a difference in blood pressure between your right and left arms. A blood pressure reading that is higher than normal on one occasion does  not mean that you need treatment. If it is not clear whether you have high blood pressure, you may be asked to return on a different day to have your blood pressure checked again. Or, you may be asked to monitor your blood pressure at home for 1 or more weeks. How is this treated? Treating high blood pressure includes making lifestyle changes and possibly taking medicine. Living a healthy lifestyle can help lower high blood pressure. You may need to change some of your habits. Lifestyle changes may include:  Following the DASH diet. This diet is high in fruits, vegetables, and whole grains. It is low in salt, red meat, and added sugars.  Keep your sodium intake below 2,300 mg per day.  Getting at least 30-45 minutes of aerobic exercise at least 4 times per week.  Losing weight if necessary.  Not smoking.  Limiting alcoholic beverages.  Learning ways to reduce stress. Your health care provider may prescribe medicine if lifestyle changes are not enough to get your blood pressure under control, and if one of the following is true:  You are 18-59 years of age and your systolic blood pressure is above 140.  You are 60 years of age or older, and your systolic blood pressure is above 150.  Your diastolic blood pressure is above 90.  You have diabetes, and your systolic blood pressure is over 140 or your diastolic blood pressure is over 90.  You have kidney disease and your blood pressure is above 140/90.  You have heart disease and your blood pressure is above 140/90. Your personal target blood pressure may vary depending on your medical   conditions, your age, and other factors. Follow these instructions at home:  Have your blood pressure rechecked as directed by your health care provider.  Take medicines only as directed by your health care provider. Follow the directions carefully. Blood pressure medicines must be taken as prescribed. The medicine does not work as well when you skip  doses. Skipping doses also puts you at risk for problems.  Do not smoke.  Monitor your blood pressure at home as directed by your health care provider. Contact a health care provider if:  You think you are having a reaction to medicines taken.  You have recurrent headaches or feel dizzy.  You have swelling in your ankles.  You have trouble with your vision. Get help right away if:  You develop a severe headache or confusion.  You have unusual weakness, numbness, or feel faint.  You have severe chest or abdominal pain.  You vomit repeatedly.  You have trouble breathing. This information is not intended to replace advice given to you by your health care provider. Make sure you discuss any questions you have with your health care provider. Document Released: 10/05/2005 Document Revised: 03/12/2016 Document Reviewed: 07/28/2013 Elsevier Interactive Patient Education  2017 Elsevier Inc.  

## 2016-09-25 ENCOUNTER — Telehealth: Payer: Self-pay

## 2016-09-25 ENCOUNTER — Telehealth: Payer: Self-pay | Admitting: Family Medicine

## 2016-09-25 NOTE — Progress Notes (Signed)
Subjective:  Patient ID: Lisa Pham, female    DOB: 12/23/1955  Age: 60 y.o. MRN: [id number]  CC: Follow-up (fell, hit head, chest and feel on knees); Headache; and Hypertension   HPI Lisa Pham is a 60 year old female with a history of hypertension, COPD, seasonal allergies, GERD, depression, migraines, hyperlipidemia who comes into the clinic for a follow up visit.  Her blood pressure is controlled on amlodipine; she informs me she has been compliant with a low-sodium diet.  She continues to have low back pain which radiates to her right butt cheek . Moving and heavy lifting exacerbate her low back pain. Reflux symptoms are controlled on her PPI, she has no COPD exacerbations and migraines have been controlled on current regimen.  She took fall a month ago where she hit her chest and headed and was evaluated at the ED; imaging came back negative. She does have residual chest pain on deep palpation which has improved significantly from time of trauma.  Past Medical History:  Diagnosis Date  . Alcohol withdrawal (HCC)   . Alcoholism (HCC)   . Anxiety   . Arthritis    "all over" (05/14/2015)  . Asthma   . B12 deficiency anemia   . Chronic lower back pain   . Colon polyps   . COPD (chronic obstructive pulmonary disease) (HCC)   . Depression   . Diverticulosis   . Gastric ulcer   . GERD (gastroesophageal reflux disease)   . Heart murmur   . High cholesterol   . History of stomach ulcers   . Hyperparathyroidism (HCC)   . Hypertension   . Migraine    "3-4 times/wk or more" (05/14/2015)  . Myocardial infarction ~ 2011   "mild"  . Pancreatitis   . Pneumonia ~ 2013    Past Surgical History:  Procedure Laterality Date  . ABDOMINAL HYSTERECTOMY    . EXCISIONAL HEMORRHOIDECTOMY  ~ 1970  . FRACTURE SURGERY    . THUMB FUSION Bilateral ~ 2010   "took something from wrist/forearm & put it in my thumb"  . TIBIA FRACTURE SURGERY Left ~ 2000  . TUBAL LIGATION       Allergies  Allergen Reactions  . Latex Hives and Itching  . Penicillins Other (See Comments)    Yeast infection  . Lisinopril Rash  . Nicotine Rash    The adhesive in nicotine patch causes her to break out.    Outpatient Medications Prior to Visit  Medication Sig Dispense Refill  . aspirin-acetaminophen-caffeine (EXCEDRIN MIGRAINE) 250-250-65 MG per tablet Take 2 tablets by mouth every 6 (six) hours as needed for migraine.    Marland Kitchen. atorvastatin (LIPITOR) 40 MG tablet TAKE 1 TABLET BY MOUTH DAILY 30 tablet 2  . dicyclomine (BENTYL) 10 MG capsule TAKE 1-2 CAPSULES BY MOUTH EVERY 8 HOURS. 60 capsule 3  . fluticasone (FLONASE) 50 MCG/ACT nasal spray USE 1 SPRAY IN EACH NOSTRIL DAILY 16 g 2  . nystatin (MYCOSTATIN/NYSTOP) 100000 UNIT/GM POWD May use twice daily under breast 30 g 1  . ranitidine (ZANTAC) 150 MG tablet TAKE 1-2 TABLETS BY MOUTH DAILY. 180 tablet 3  . albuterol (PROVENTIL HFA;VENTOLIN HFA) 108 (90 Base) MCG/ACT inhaler Inhale 1-2 puffs into the lungs every 6 (six) hours as needed for wheezing or shortness of breath. 1 Inhaler 2  . amLODipine (NORVASC) 10 MG tablet Take 1 tablet (10 mg total) by mouth daily. 30 tablet 2  . budesonide-formoterol (SYMBICORT) 160-4.5 MCG/ACT inhaler Inhale 2 puffs into the lungs 2 (  two) times daily. 1 Inhaler 3  . gabapentin (NEURONTIN) 600 MG tablet Take 1 tablet (600 mg total) by mouth 3 (three) times daily. 90 tablet 2  . montelukast (SINGULAIR) 10 MG tablet Take 1 tablet (10 mg total) by mouth at bedtime. 30 tablet 2  . pantoprazole (PROTONIX) 40 MG tablet Take 1 tablet (40 mg total) by mouth daily. 30 tablet 3  . potassium chloride SA (K-DUR,KLOR-CON) 20 MEQ tablet Take 1 tablet (20 mEq total) by mouth daily. 30 tablet 3  . tiotropium (SPIRIVA) 18 MCG inhalation capsule Place 1 capsule (18 mcg total) into inhaler and inhale daily. 30 capsule 2  . traZODone (DESYREL) 100 MG tablet Take 1 tablet (100 mg total) by mouth at bedtime as needed for  sleep. 30 tablet 2  . venlafaxine (EFFEXOR) 25 MG tablet Take 1 tablet (25 mg total) by mouth 2 (two) times daily. 60 tablet 2  . vitamin B-12 (CYANOCOBALAMIN) 1000 MCG tablet Take 1,000 mcg by mouth daily.    Marland Kitchen topiramate (TOPAMAX) 50 MG tablet TAKE 1 TABLET TWICE A DAY (Patient not taking: Reported on 09/24/2016) 60 tablet 2   No facility-administered medications prior to visit.     ROS Review of Systems Constitutional: Negative for activity change, appetite change and fatigue.  HENT: Negative for congestion, sinus pressure and sore throat.   Eyes: Negative for visual disturbance.  Respiratory: Negative for cough, chest pain, shortness of breath and wheezing.   Cardiovascular: Negative for chest pain and palpitations.  Gastrointestinal: Negative for abdominal distention, abdominal pain and constipation.  Endocrine: Negative for polydipsia.  Genitourinary: Negative for dysuria and frequency.  Musculoskeletal: Positive for back pain. Negative for arthralgias.  Skin: Negative for rash.  Neurological: Negative for tremors, light-headedness and numbness.  Hematological: Does not bruise/bleed easily.  Psychiatric/Behavioral: Negative for agitation and behavioral problems  Objective:  BP 136/82 (BP Location: Right Arm, Patient Position: Sitting, Cuff Size: Small)   Pulse 80   Temp 98.5 F (36.9 C) (Oral)   Ht 5\' 4"  (1.626 m)   Wt 153 lb 12.8 oz (69.8 kg)   SpO2 99%   BMI 26.40 kg/m   BP/Weight 09/24/2016 07/13/2016 06/24/2016  Systolic BP 136 131 150  Diastolic BP 82 74 96  Wt. (Lbs) 153.8 162.8 165.13  BMI 26.4 28.16 29.02      Physical Exam Constitutional: She is oriented to person, place, and time. She appears well-developed and well-nourished.  HEENT: normal Neck: no JVD Cardiovascular: Normal rate, normal heart sounds and intact distal pulses.   No murmur heard. Pulmonary/Chest: Effort normal and breath sounds normal. She has no wheezes. She has no rales. She exhibits  mild tenderness. to palpation Abdominal: Soft. Bowel sounds are normal. She exhibits no distension and no mass. There is no tenderness.  Musculoskeletal: She exhibits tenderness (lumbar tenderness to palpation. Positive straight leg raise on the right).  Neurological: She is alert and oriented to person, place, and time.  Pscyh: normal  Assessment & Plan:   1. Hypokalemia Repeat BMET - potassium chloride SA (K-DUR,KLOR-CON) 20 MEQ tablet; Take 1 tablet (20 mEq total) by mouth daily.  Dispense: 30 tablet; Refill: 3 - Basic Metabolic Panel  2. Mild episode of recurrent major depressive disorder (HCC) Stable - venlafaxine (EFFEXOR) 25 MG tablet; Take 1 tablet (25 mg total) by mouth 2 (two) times daily.  Dispense: 60 tablet; Refill: 2 - traZODone (DESYREL) 100 MG tablet; Take 1 tablet (100 mg total) by mouth at bedtime as needed for  sleep.  Dispense: 30 tablet; Refill: 2  3. Migraine without aura and without status migrainosus, not intractable Controlled - topiramate (TOPAMAX) 50 MG tablet; Take 1 tablet (50 mg total) by mouth 2 (two) times daily.  Dispense: 60 tablet; Refill: 2  4. Pulmonary emphysema, unspecified emphysema type (HCC) No acute exacerbations - tiotropium (SPIRIVA) 18 MCG inhalation capsule; Place 1 capsule (18 mcg total) into inhaler and inhale daily.  Dispense: 30 capsule; Refill: 2 - albuterol (PROVENTIL HFA;VENTOLIN HFA) 108 (90 Base) MCG/ACT inhaler; Inhale 1-2 puffs into the lungs every 6 (six) hours as needed for wheezing or shortness of breath.  Dispense: 1 Inhaler; Refill: 2 - budesonide-formoterol (SYMBICORT) 160-4.5 MCG/ACT inhaler; Inhale 2 puffs into the lungs 2 (two) times daily.  Dispense: 1 Inhaler; Refill: 3  5. Gastroesophageal reflux disease, esophagitis presence not specified Controlled - pantoprazole (PROTONIX) 40 MG tablet; Take 1 tablet (40 mg total) by mouth daily.  Dispense: 30 tablet; Refill: 3  6. Chronic seasonal allergic rhinitis due to other  allergen Stable - montelukast (SINGULAIR) 10 MG tablet; Take 1 tablet (10 mg total) by mouth at bedtime.  Dispense: 30 tablet; Refill: 2  7. Essential hypertension Controlled - amLODipine (NORVASC) 10 MG tablet; Take 1 tablet (10 mg total) by mouth daily.  Dispense: 30 tablet; Refill: 2   Meds ordered this encounter  Medications  . venlafaxine (EFFEXOR) 25 MG tablet    Sig: Take 1 tablet (25 mg total) by mouth 2 (two) times daily.    Dispense:  60 tablet    Refill:  2  . traZODone (DESYREL) 100 MG tablet    Sig: Take 1 tablet (100 mg total) by mouth at bedtime as needed for sleep.    Dispense:  30 tablet    Refill:  2  . topiramate (TOPAMAX) 50 MG tablet    Sig: Take 1 tablet (50 mg total) by mouth 2 (two) times daily.    Dispense:  60 tablet    Refill:  2  . tiotropium (SPIRIVA) 18 MCG inhalation capsule    Sig: Place 1 capsule (18 mcg total) into inhaler and inhale daily.    Dispense:  30 capsule    Refill:  2  . potassium chloride SA (K-DUR,KLOR-CON) 20 MEQ tablet    Sig: Take 1 tablet (20 mEq total) by mouth daily.    Dispense:  30 tablet    Refill:  3  . pantoprazole (PROTONIX) 40 MG tablet    Sig: Take 1 tablet (40 mg total) by mouth daily.    Dispense:  30 tablet    Refill:  3  . montelukast (SINGULAIR) 10 MG tablet    Sig: Take 1 tablet (10 mg total) by mouth at bedtime.    Dispense:  30 tablet    Refill:  2  . gabapentin (NEURONTIN) 600 MG tablet    Sig: Take 1 tablet (600 mg total) by mouth 3 (three) times daily.    Dispense:  90 tablet    Refill:  2  . albuterol (PROVENTIL HFA;VENTOLIN HFA) 108 (90 Base) MCG/ACT inhaler    Sig: Inhale 1-2 puffs into the lungs every 6 (six) hours as needed for wheezing or shortness of breath.    Dispense:  1 Inhaler    Refill:  2  . amLODipine (NORVASC) 10 MG tablet    Sig: Take 1 tablet (10 mg total) by mouth daily.    Dispense:  30 tablet    Refill:  2    Discontinue  Lisinopril  . budesonide-formoterol (SYMBICORT)  160-4.5 MCG/ACT inhaler    Sig: Inhale 2 puffs into the lungs 2 (two) times daily.    Dispense:  1 Inhaler    Refill:  3    Follow-up: Return in about 3 months (around 12/23/2016) for Follow-up chronic medical conditions.   Jaclyn Shaggy MD

## 2016-09-25 NOTE — Telephone Encounter (Signed)
Writer called patient who stated her pharmacy gave her lisinopril and that she is allergic to it.  Patient believes this was done by accident.  Writer encouraged her to call the pharmacy explain the situation and ask for her money back.

## 2016-09-25 NOTE — Telephone Encounter (Signed)
Calling to inform nurse that [facility] said she could being back the medicine and the receipt to the pharmacy and they will reimburse her

## 2016-09-25 NOTE — Telephone Encounter (Signed)
Pt informing PCP that [facility] gave pt Lisinopril which is the medication that she is allergic to

## 2016-10-01 ENCOUNTER — Telehealth: Payer: Self-pay

## 2016-10-01 NOTE — Telephone Encounter (Signed)
Writer called patient to discuss lab results.  Writer was unable to leave a message.

## 2016-10-01 NOTE — Telephone Encounter (Signed)
-----   Message from Enobong Amao, MD sent at 09/25/2016  9:08 AM EST ----- Please inform the patient that labs are normal. Thank you. 

## 2016-10-02 ENCOUNTER — Telehealth: Payer: Self-pay

## 2016-10-02 NOTE — Telephone Encounter (Signed)
-----   Message from Jaclyn ShaggyEnobong Amao, MD sent at 09/25/2016  9:08 AM EST ----- Please inform the patient that labs are normal. Thank you.

## 2016-10-02 NOTE — Telephone Encounter (Signed)
Writer called patient per Dr. Amao and discussed lab results.  Patient stated understanding. 

## 2016-10-05 ENCOUNTER — Telehealth: Payer: Self-pay | Admitting: Family Medicine

## 2016-10-05 NOTE — Telephone Encounter (Signed)
Pt calling stating she needs a letter from PCP before she goes to the dentist stating whether it is okay or not for pt to take antibiotics due to her heart murmur   Pt has an appointment with the dentist on Jan 11th States dentist is requesting this letter and since she does not have a cardiologist, the letter needs to come from PCP

## 2016-10-05 NOTE — Telephone Encounter (Signed)
Patient called the office to speak with nurse regarding a referral that she needs to see the cardiologist as per recommendation of ED doctor. Pt has medicaid. Please follow up.   Thank you.

## 2016-10-06 NOTE — Telephone Encounter (Signed)
I am unable to place a cardiology referral based on just recommendations of an ED doctor. There needs to be an indication for a referral. I will be happy to discuss this with her and place a referral if indicated at an office visit.

## 2016-10-06 NOTE — Telephone Encounter (Signed)
Writer gave note to schedulers to please schedule appt with MD to discuss cardiology referral.

## 2016-10-08 NOTE — Telephone Encounter (Signed)
Ready for pick up

## 2016-10-09 NOTE — Telephone Encounter (Signed)
Writer spoke with patient to let her know that the letter for the dentist is ready for pick up.  Patient states she has an appt with Dr. Venetia Pham on 10/20/16 and will pick it up then as well as the info on scheduling her mammo.  The letter and mammo info is in an envelope in the RN office in the  OUT bin.

## 2016-10-20 ENCOUNTER — Ambulatory Visit: Payer: Self-pay | Admitting: Family Medicine

## 2016-10-27 ENCOUNTER — Encounter: Payer: Self-pay | Admitting: Family Medicine

## 2016-10-27 ENCOUNTER — Other Ambulatory Visit: Payer: Self-pay

## 2016-10-27 ENCOUNTER — Encounter: Payer: Self-pay | Admitting: Licensed Clinical Social Worker

## 2016-10-27 ENCOUNTER — Ambulatory Visit: Payer: Medicaid Other | Attending: Family Medicine | Admitting: Family Medicine

## 2016-10-27 ENCOUNTER — Telehealth: Payer: Self-pay | Admitting: *Deleted

## 2016-10-27 VITALS — BP 149/83 | HR 83 | Temp 98.7°F | Ht 64.0 in | Wt 154.8 lb

## 2016-10-27 DIAGNOSIS — I10 Essential (primary) hypertension: Secondary | ICD-10-CM | POA: Insufficient documentation

## 2016-10-27 DIAGNOSIS — I252 Old myocardial infarction: Secondary | ICD-10-CM | POA: Insufficient documentation

## 2016-10-27 DIAGNOSIS — J449 Chronic obstructive pulmonary disease, unspecified: Secondary | ICD-10-CM | POA: Diagnosis not present

## 2016-10-27 DIAGNOSIS — Z79899 Other long term (current) drug therapy: Secondary | ICD-10-CM | POA: Insufficient documentation

## 2016-10-27 DIAGNOSIS — L304 Erythema intertrigo: Secondary | ICD-10-CM | POA: Diagnosis not present

## 2016-10-27 DIAGNOSIS — M25511 Pain in right shoulder: Secondary | ICD-10-CM

## 2016-10-27 DIAGNOSIS — R072 Precordial pain: Secondary | ICD-10-CM

## 2016-10-27 DIAGNOSIS — Z91040 Latex allergy status: Secondary | ICD-10-CM | POA: Insufficient documentation

## 2016-10-27 DIAGNOSIS — R9431 Abnormal electrocardiogram [ECG] [EKG]: Secondary | ICD-10-CM

## 2016-10-27 DIAGNOSIS — Z880 Allergy status to penicillin: Secondary | ICD-10-CM | POA: Diagnosis not present

## 2016-10-27 DIAGNOSIS — K219 Gastro-esophageal reflux disease without esophagitis: Secondary | ICD-10-CM | POA: Insufficient documentation

## 2016-10-27 DIAGNOSIS — F329 Major depressive disorder, single episode, unspecified: Secondary | ICD-10-CM | POA: Insufficient documentation

## 2016-10-27 MED ORDER — NAPROXEN 500 MG PO TABS: 500.0000 mg | ORAL_TABLET | Freq: Two times a day (BID) | ORAL | 1 refills | Status: AC

## 2016-10-27 MED ORDER — CLOTRIMAZOLE 1 % EX CREA: 1.0000 "application " | TOPICAL_CREAM | Freq: Two times a day (BID) | CUTANEOUS | 1 refills | Status: AC

## 2016-10-27 MED ORDER — GABAPENTIN 600 MG PO TABS: 600.0000 mg | ORAL_TABLET | Freq: Three times a day (TID) | ORAL | 2 refills | Status: DC

## 2016-10-27 MED ORDER — NYSTATIN 100000 UNIT/GM EX POWD: CUTANEOUS | 1 refills | Status: AC

## 2016-10-27 NOTE — Telephone Encounter (Signed)
Pt medication Gabapentin not covered due to being a tablet. Medication will be covered by Medicaid in capsule form. Ivar DrapeS. Karl, Clinical Pharmacist will change pill form so patient may get medication. Dr. Bella KennedyAmoa aware.

## 2016-10-27 NOTE — BH Specialist Note (Signed)
Session Start time: 10:24 AM   End Time: 10:54 AM Total Time:  30 minutes Type of Service: Behavioral Health - Individual/Family Interpreter: No.   Interpreter Name & Language: N/A # [facility]-June [facility]   SUBJECTIVE: Lisa Pham is a 61 y.o. female  Pt. was referred by Dr. Venetia NightAmao for:  anxiety and depression. Pt. reports the following symptoms/concerns: irritability, withdrawn behavior, difficulty sleeping, and substance use (alcohol) Duration of problem:  1967  Severity: mild Previous treatment: Pt has hx of receiving therapy and medication management. She is currently not receiving any services.   OBJECTIVE: Mood: Anxious & Affect: Appropriate Risk of harm to self or others: Pt denied SI/HI Assessments administered: PHQ-9; GAD-7  LIFE CONTEXT:  Family & Social: [place] to [place] daughter and grandchildren.  School/ Work: Pt receives disability ($750), Medicaid, and Food Stamps [id number]($116) Self-Care: Pt has difficulty sleeping. Pt reports "i'm an alcoholic but I slowed down a lot" Pt stated that she no longer drinks liquor and will drink alcohol socially  Life changes: Pt recently moved to her own apartment on 08/04/16.  What is important to pt/family (values): Family, Independence   GOALS ADDRESSED:  Decrease symptoms of anxiety Decrease symptoms of depression  INTERVENTIONS: Solution Focused, Strength-based and Supportive   ASSESSMENT:  Pt currently experiencing depression and anxiety. She reports irritability, withdrawn behavior, difficulty sleeping, and substance use (alcohol) Pt may benefit from therapy and medication management. LCSWA educated pt on the cycle of depression and anxiety and discussed how substance use can negatively impact pt's mental and physical health. Pt and LCSWA discussed realistic healthy coping skills that can assist in a decrease symptoms. LCSWA provided pt with community resources on crisis  intervention, therapy, and medication management.     PLAN: 1. F/U with behavioral health clinician: Pt was encouraged to contact LCSWA if symptoms worsen or fail to improve to [facility]. 2. Behavioral Health meds: Effexor and Desyrel 3. Behavioral recommendations: LCSWA recommends that pt apply healthy coping skills discussed. Pt is encouraged to schedule follow up appointment with LCSWA 4. Referral: Brief Counseling/Psychotherapy, [facility], Problem-solving teaching/coping strategies, Psychoeducation and Supportive Counseling 5. From scale of 1-10, how likely are you to follow plan: 8/10   Lisa Pham, MSW, [facility]  Clinical Social Worker 10/28/16 10:03 AM  Marlon PelWarmhandoff:   Warm Hand Off Completed.

## 2016-10-27 NOTE — Progress Notes (Signed)
Subjective:  Patient ID: Lisa Pham, female    DOB: [birth year]  Age: 61 y.o. MRN: [id number]  CC: Hypertension; Shoulder Pain (right sided); Anxiety; and Breast Pain (right breast)   HPI Lisa Pham is a 61 year old female with a history of hypertension, COPD, seasonal allergies, GERD, depression, migraines, hyperlipidemia who comes into the clinic for a Follow-up visit.  She continues to have precordial chest pain after her fall 6 weeks ago and informs me at her ED at [facility] visit it was recommended she see cardiology. Her chest pain is constant and whenever she bends forwards she feels "like her heart is about to fall out". Denies shortness of breath.  She complains of right shoulder pain for the last 1 week worse on moving her arm. Also complains of rash beneath breasts which are pruritic and she has run out of the nystatin powder she usually applies there.  Past Medical History:  Diagnosis Date  . Alcohol withdrawal (HCC)   . Alcoholism (HCC)   . Anxiety   . Arthritis    "all over" (05/14/2015)  . Asthma   . B12 deficiency anemia   . Chronic lower back pain   . Colon polyps   . COPD (chronic obstructive pulmonary disease) (HCC)   . Depression   . Diverticulosis   . Gastric ulcer   . GERD (gastroesophageal reflux disease)   . Heart murmur   . High cholesterol   . History of stomach ulcers   . Hyperparathyroidism (HCC)   . Hypertension   . Migraine    "3-4 times/wk or more" (05/14/2015)  . Myocardial infarction ~ 2011   "mild"  . Pancreatitis   . Pneumonia ~ 2013    Past Surgical History:  Procedure Laterality Date  . ABDOMINAL HYSTERECTOMY    . EXCISIONAL HEMORRHOIDECTOMY  ~ 1970  . FRACTURE SURGERY    . THUMB FUSION Bilateral ~ 2010   "took something from wrist/forearm & put it in my thumb"  . TIBIA FRACTURE SURGERY Left ~ 2000  . TUBAL LIGATION      Allergies  Allergen Reactions  . Latex Hives and Itching  . Penicillins Other (See  Comments)    Yeast infection  . Lisinopril Rash  . Nicotine Rash    The adhesive in nicotine patch causes her to break out.     Outpatient Medications Prior to Visit  Medication Sig Dispense Refill  . albuterol (PROVENTIL HFA;VENTOLIN HFA) 108 (90 Base) MCG/ACT inhaler Inhale 1-2 puffs into the lungs every 6 (six) hours as needed for wheezing or shortness of breath. 1 Inhaler 2  . amLODipine (NORVASC) 10 MG tablet Take 1 tablet (10 mg total) by mouth daily. 30 tablet 2  . aspirin-acetaminophen-caffeine (EXCEDRIN MIGRAINE) 250-250-65 MG per tablet Take 2 tablets by mouth every 6 (six) hours as needed for migraine.    Marland Kitchen atorvastatin (LIPITOR) 40 MG tablet TAKE 1 TABLET BY MOUTH DAILY 30 tablet 2  . budesonide-formoterol (SYMBICORT) 160-4.5 MCG/ACT inhaler Inhale 2 puffs into the lungs 2 (two) times daily. 1 Inhaler 3  . dicyclomine (BENTYL) 10 MG capsule TAKE 1-2 CAPSULES BY MOUTH EVERY 8 HOURS. 60 capsule 3  . fluticasone (FLONASE) 50 MCG/ACT nasal spray USE 1 SPRAY IN EACH NOSTRIL DAILY 16 g 2  . montelukast (SINGULAIR) 10 MG tablet Take 1 tablet (10 mg total) by mouth at bedtime. 30 tablet 2  . pantoprazole (PROTONIX) 40 MG tablet Take 1 tablet (40 mg total) by mouth daily. 30 tablet  3  . potassium chloride SA (K-DUR,KLOR-CON) 20 MEQ tablet Take 1 tablet (20 mEq total) by mouth daily. 30 tablet 3  . ranitidine (ZANTAC) 150 MG tablet TAKE 1-2 TABLETS BY MOUTH DAILY. 180 tablet 3  . tiotropium (SPIRIVA) 18 MCG inhalation capsule Place 1 capsule (18 mcg total) into inhaler and inhale daily. 30 capsule 2  . topiramate (TOPAMAX) 50 MG tablet Take 1 tablet (50 mg total) by mouth 2 (two) times daily. 60 tablet 2  . traZODone (DESYREL) 100 MG tablet Take 1 tablet (100 mg total) by mouth at bedtime as needed for sleep. 30 tablet 2  . gabapentin (NEURONTIN) 600 MG tablet Take 1 tablet (600 mg total) by mouth 3 (three) times daily. 90 tablet 2  . nystatin (MYCOSTATIN/NYSTOP) 100000 UNIT/GM POWD May  use twice daily under breast 30 g 1  . venlafaxine (EFFEXOR) 25 MG tablet Take 1 tablet (25 mg total) by mouth 2 (two) times daily. (Patient not taking: Reported on 10/27/2016) 60 tablet 2  . vitamin B-12 (CYANOCOBALAMIN) 1000 MCG tablet Take 1,000 mcg by mouth daily.     No facility-administered medications prior to visit.     ROS Review of Systems  Constitutional: Negative for activity change, appetite change and fatigue.  HENT: Negative for congestion, sinus pressure and sore throat.   Eyes: Negative for visual disturbance.  Respiratory: Negative for cough, chest tightness, shortness of breath and wheezing.   Cardiovascular: Negative for chest pain and palpitations.  Gastrointestinal: Negative for abdominal distention, abdominal pain and constipation.  Endocrine: Negative for polydipsia.  Genitourinary: Negative for dysuria and frequency.  Musculoskeletal:       See hpi   Skin: Positive for rash.  Neurological: Negative for tremors, light-headedness and numbness.  Hematological: Does not bruise/bleed easily.  Psychiatric/Behavioral: Negative for agitation and behavioral problems.    Objective:  BP (!) 149/83 (BP Location: Right Arm, Patient Position: Sitting, Cuff Size: Small)   Pulse 83   Temp 98.7 F (37.1 C) (Oral)   Ht 5\' 4"  (1.626 m)   Wt 154 lb 12.8 oz (70.2 kg)   SpO2 99%   BMI 26.57 kg/m   BP/Weight 10/27/2016 09/24/2016 07/13/2016  Systolic BP 149 136 131  Diastolic BP 83 82 74  Wt. (Lbs) 154.8 153.8 162.8  BMI 26.57 26.4 28.16      Physical Exam  Constitutional: She is oriented to person, place, and time. She appears well-developed and well-nourished.  Cardiovascular: Normal rate and intact distal pulses.   Murmur heard. Pulmonary/Chest: Effort normal and breath sounds normal. She has no wheezes. She has no rales. She exhibits tenderness (reproducible in parasternal area).  Abdominal: Soft. Bowel sounds are normal. She exhibits no distension and no mass.  There is no tenderness.  Musculoskeletal: She exhibits tenderness (tenderness on range of motion of right shoulder).  Neurological: She is alert and oriented to person, place, and time.  Skin:  Candida rash in bilateral inframammary region     Assessment & Plan:   1. Precordial pain Reproducible pain after trauma which occurred 6 weeks ago We'll treated for costochondritis as well - naproxen (NAPROSYN) 500 MG tablet; Take 1 tablet (500 mg total) by mouth 2 (two) times daily with a meal.  Dispense: 30 tablet; Refill: 1 - Ambulatory referral to Cardiology  2. Intertrigo Advised to undergo proper aeration beneath breasts - nystatin (NYSTATIN) powder; May use twice daily under breast  Dispense: 30 g; Refill: 1 - naproxen (NAPROSYN) 500 MG tablet; Take 1 tablet (  500 mg total) by mouth 2 (two) times daily with a meal.  Dispense: 30 tablet; Refill: 1  3. Pain in joint of right shoulder Could be underlying osteoarthritis - naproxen (NAPROSYN) 500 MG tablet; Take 1 tablet (500 mg total) by mouth 2 (two) times daily with a meal.  Dispense: 30 tablet; Refill: 1  4. Nonspecific abnormal electrocardiogram (ECG) (EKG) ST and T-wave inversions in anterior leads Referred to cardiology especially in the setting of chest pains - Ambulatory referral to Cardiology   Meds ordered this encounter  Medications  . nystatin (NYSTATIN) powder    Sig: May use twice daily under breast    Dispense:  30 g    Refill:  1  . naproxen (NAPROSYN) 500 MG tablet    Sig: Take 1 tablet (500 mg total) by mouth 2 (two) times daily with a meal.    Dispense:  30 tablet    Refill:  1  . clotrimazole (LOTRIMIN) 1 % cream    Sig: Apply 1 application topically 2 (two) times daily.    Dispense:  45 g    Refill:  1  . gabapentin (NEURONTIN) 600 MG tablet    Sig: Take 1 tablet (600 mg total) by mouth 3 (three) times daily.    Dispense:  90 tablet    Refill:  2    Follow-up: Return in about 3 months (around 01/25/2017)  for follow up on chronic medical conditions.   Jaclyn ShaggyEnobong Amao MD

## 2016-10-27 NOTE — Progress Notes (Signed)
Med refills

## 2016-11-06 ENCOUNTER — Telehealth: Payer: Self-pay | Admitting: Family Medicine

## 2016-11-06 NOTE — Telephone Encounter (Signed)
Pt aware of recommendations. States she has noticed scabbing on the edges of the wound. Pink in color.  Pt informed of signs to be aware of for infection. Pt instructed to call office on Monday.

## 2016-11-06 NOTE — Telephone Encounter (Signed)
Pt calling requesting to speak to nurse  Pt was in the hospital a week ago and states she usually breaks out from the heart monitor patches, but she has an abrasion on the R side of her chest.  States the abrasion is red, inflamed, burning, and painful. States it looks like she has a burn.   Pt is worried about MERSA and wants to know if she should be seen by the doctor or is there is something over-the-counter she can take to help   Pt states she has used neosporin for a week, but it has not helped

## 2016-11-06 NOTE — Telephone Encounter (Signed)
An allergic reaction to EKG patches is different from an infection. It is difficult to tell if she has MRSA without seeing the patient. OTC hydrocortisone cream can help with reaction to EKG patches but for an infection she will need to be seen.

## 2016-11-09 NOTE — Progress Notes (Signed)
HPI: 61 year old female for evaluation of chest pain. Patient states she fell in November. She struck her chest and developed pain at that time. She was admitted to [facility]. Enzymes and hemoglobin normal. Chest CT showed no pulmonary embolus. Echocardiogram showed normal LV function and no pericardial effusion. She has continued to have pain in the left breast area described as a stabbing sensation increased with certain movements and particularly bending over. Last 5 minutes and resolves. She does not typically have exertional chest pain. She notes dyspnea all the time which she attributes to her lung disease. It is unchanged. There is no increased pedal edema or syncope. Because of the above we were asked to evaluate.   Current Outpatient Prescriptions  Medication Sig Dispense Refill  . albuterol (PROVENTIL HFA;VENTOLIN HFA) 108 (90 Base) MCG/ACT inhaler Inhale 1-2 puffs into the lungs every 6 (six) hours as needed for wheezing or shortness of breath. 1 Inhaler 2  . amLODipine (NORVASC) 10 MG tablet Take 1 tablet (10 mg total) by mouth daily. 30 tablet 2  . aspirin-acetaminophen-caffeine (EXCEDRIN MIGRAINE) 250-250-65 MG per tablet Take 2 tablets by mouth every 6 (six) hours as needed for migraine.    Marland Kitchen atorvastatin (LIPITOR) 40 MG tablet TAKE 1 TABLET BY MOUTH DAILY 30 tablet 2  . budesonide-formoterol (SYMBICORT) 160-4.5 MCG/ACT inhaler Inhale 2 puffs into the lungs 2 (two) times daily. 1 Inhaler 3  . clotrimazole (LOTRIMIN) 1 % cream Apply 1 application topically 2 (two) times daily. 45 g 1  . dicyclomine (BENTYL) 10 MG capsule TAKE 1-2 CAPSULES BY MOUTH EVERY 8 HOURS. 60 capsule 3  . fluticasone (FLONASE) 50 MCG/ACT nasal spray USE 1 SPRAY IN EACH NOSTRIL DAILY 16 g 2  . gabapentin (NEURONTIN) 300 MG capsule Take 2 capsules (600 mg total) by mouth 3 (three) times daily. 180 capsule 2  . montelukast (SINGULAIR) 10 MG tablet Take 1 tablet (10 mg total) by mouth at bedtime. 30  tablet 2  . naproxen (NAPROSYN) 500 MG tablet Take 1 tablet (500 mg total) by mouth 2 (two) times daily with a meal. 30 tablet 1  . nystatin (NYSTATIN) powder May use twice daily under breast 30 g 1  . pantoprazole (PROTONIX) 40 MG tablet Take 1 tablet (40 mg total) by mouth daily. 30 tablet 3  . potassium chloride SA (K-DUR,KLOR-CON) 20 MEQ tablet Take 1 tablet (20 mEq total) by mouth daily. 30 tablet 3  . ranitidine (ZANTAC) 150 MG tablet TAKE 1-2 TABLETS BY MOUTH DAILY. 180 tablet 3  . tiotropium (SPIRIVA) 18 MCG inhalation capsule Place 1 capsule (18 mcg total) into inhaler and inhale daily. 30 capsule 2  . topiramate (TOPAMAX) 50 MG tablet Take 1 tablet (50 mg total) by mouth 2 (two) times daily. 60 tablet 2  . traZODone (DESYREL) 100 MG tablet Take 1 tablet (100 mg total) by mouth at bedtime as needed for sleep. 30 tablet 2  . venlafaxine (EFFEXOR) 25 MG tablet Take 1 tablet (25 mg total) by mouth 2 (two) times daily. 60 tablet 2  . vitamin B-12 (CYANOCOBALAMIN) 1000 MCG tablet Take 1,000 mcg by mouth daily.     No current facility-administered medications for this visit.     Allergies  Allergen Reactions  . Other Hives  . Latex Hives and Itching  . Lisinopril Rash  . Nicotine Rash    ADHESIVE ON PATCH The adhesive in nicotine patch causes her to break out.  Marland Kitchen Penicillin G Rash  .  Penicillins Other (See Comments) and Rash    Yeast infection  . Sulfa Antibiotics Rash  . Tape Rash     Past Medical History:  Diagnosis Date  . Alcohol withdrawal (HCC)   . Alcoholism (HCC)   . Anxiety   . Arthritis    "all over" (05/14/2015)  . Asthma   . B12 deficiency anemia   . Chronic lower back pain   . Colon polyps   . COPD (chronic obstructive pulmonary disease) (HCC)   . Depression   . Diverticulosis   . Gastric ulcer   . GERD (gastroesophageal reflux disease)   . Heart murmur   . High cholesterol   . History of stomach ulcers   . Hyperparathyroidism (HCC)   . Hypertension     . Migraine    "3-4 times/wk or more" (05/14/2015)  . Pancreatitis   . Pneumonia ~ 2013    Past Surgical History:  Procedure Laterality Date  . ABDOMINAL HYSTERECTOMY    . EXCISIONAL HEMORRHOIDECTOMY  ~ 1970  . FRACTURE SURGERY    . THUMB FUSION Bilateral ~ 2010   "took something from wrist/forearm & put it in my thumb"  . TIBIA FRACTURE SURGERY Left ~ 2000  . TUBAL LIGATION      Social History   Social History  . Marital status: Divorced    Spouse name: N/A  . Number of children: 3  . Years of education: N/A   Occupational History  .      Disabled   Social History Main Topics  . Smoking status: Current Every Day Smoker    Packs/day: 0.25    Years: 45.00    Types: Cigarettes  . Smokeless tobacco: Never Used     Comment: 1-2 cigs daily  . Alcohol use 2.4 - 3.0 oz/week    3 - 4 Glasses of wine, 1 Shots of liquor per week     Comment: Every day  . Drug use: No     Comment: "over 2 years"  . Sexual activity: Not Currently   Other Topics Concern  . Not on file   Social History Narrative  . No narrative on file    Family History  Problem Relation Age of Onset  . Cancer Mother     mets  . Cirrhosis Father   . Colon cancer Paternal Grandfather     GNF:[id number]:Chronic back pain but   no fevers or chills, productive cough, hemoptysis, dysphasia, odynophagia, melena, hematochezia, dysuria, hematuria, rash, seizure activity, orthopnea, PND, pedal edema, claudication. Remaining systems are negative.  Physical Exam:   Blood pressure (!) 147/90, pulse 79, height 5\' 4"  (1.626 m), weight 154 lb (69.9 kg).  General:  Well developed/well nourished in NAD Skin warm/dry Patient not depressed No peripheral clubbing Back-normal HEENT-normal/normal eyelids Neck supple/normal carotid upstroke bilaterally; no bruits; no JVD; no thyromegaly chest - CTA/ normal expansion CV - RRR/normal S1 and S2; no murmurs, rubs or gallops;  PMI nondisplaced Abdomen -NT/ND, no HSM, no mass, +  bowel sounds, no bruit 2+ femoral pulses, no bruits Ext-no edema, chords, 2+ DP Neuro-grossly nonfocal  ECG - 10/27/2016-sinus rhythm, anterior T-wave inversion. Prolonged QT interval.  Today's electrocardiogram shows sinus rhythm with anterior and inferior T-wave inversion.  A/P  1 chest pain-symptoms are not consistent with cardiac etiology. These have been present since she fell and struck her chest. They increase with certain movements. Previous echocardiogram showed normal LV function. Previous CTA showed no pulmonary embolus. I think they're likely musculoskeletal.  We will not pursue further ischemia evaluation.  2 abnormal electrocardiogram-she has T-wave inversions anteriorly and inferiorly which are unchanged. Previous echo showed normal LV function. I will not pursue further evaluation.  3 hypertension-blood pressure mildly elevated but she states typically controlled. Continue present medications and follow.  4 tobacco abuse-patient counseled on discontinuing.  5 alcohol abuse-patient counseled on discontinuing.   Olga Millers, MD

## 2016-11-11 NOTE — Telephone Encounter (Signed)
Called patient, states she is better.

## 2016-11-12 ENCOUNTER — Other Ambulatory Visit: Payer: Self-pay | Admitting: Pharmacist

## 2016-11-12 ENCOUNTER — Telehealth: Payer: Self-pay | Admitting: Licensed Clinical Social Worker

## 2016-11-12 MED ORDER — GABAPENTIN 300 MG PO CAPS: 600.0000 mg | ORAL_CAPSULE | Freq: Three times a day (TID) | ORAL | 2 refills | Status: DC

## 2016-11-12 NOTE — Telephone Encounter (Signed)
LCSWA attempted to contact pt to follow up on behavioral health. LCSWA left message for a return call.  

## 2016-11-18 ENCOUNTER — Encounter: Payer: Self-pay | Admitting: Cardiology

## 2016-11-18 ENCOUNTER — Ambulatory Visit (INDEPENDENT_AMBULATORY_CARE_PROVIDER_SITE_OTHER): Payer: Medicaid Other | Admitting: Cardiology

## 2016-11-18 VITALS — BP 147/90 | HR 79 | Ht 64.0 in | Wt 154.0 lb

## 2016-11-18 DIAGNOSIS — Z720 Tobacco use: Secondary | ICD-10-CM | POA: Diagnosis not present

## 2016-11-18 DIAGNOSIS — R072 Precordial pain: Secondary | ICD-10-CM

## 2016-11-18 DIAGNOSIS — I10 Essential (primary) hypertension: Secondary | ICD-10-CM | POA: Diagnosis not present

## 2016-11-18 NOTE — Patient Instructions (Signed)
Your physician recommends that you schedule a follow-up appointment in: AS NEEDED  

## 2016-12-16 ENCOUNTER — Other Ambulatory Visit: Payer: Self-pay | Admitting: Family Medicine

## 2016-12-16 DIAGNOSIS — F330 Major depressive disorder, recurrent, mild: Secondary | ICD-10-CM

## 2017-01-29 ENCOUNTER — Ambulatory Visit: Payer: Medicaid Other | Attending: Family Medicine | Admitting: Family Medicine

## 2017-01-29 ENCOUNTER — Encounter: Payer: Self-pay | Admitting: Family Medicine

## 2017-01-29 VITALS — BP 136/89 | HR 76 | Temp 98.4°F | Ht 64.0 in | Wt 142.0 lb

## 2017-01-29 DIAGNOSIS — M545 Low back pain: Secondary | ICD-10-CM | POA: Insufficient documentation

## 2017-01-29 DIAGNOSIS — Z91040 Latex allergy status: Secondary | ICD-10-CM | POA: Insufficient documentation

## 2017-01-29 DIAGNOSIS — Z7982 Long term (current) use of aspirin: Secondary | ICD-10-CM | POA: Insufficient documentation

## 2017-01-29 DIAGNOSIS — K5792 Diverticulitis of intestine, part unspecified, without perforation or abscess without bleeding: Secondary | ICD-10-CM | POA: Diagnosis not present

## 2017-01-29 DIAGNOSIS — E7800 Pure hypercholesterolemia, unspecified: Secondary | ICD-10-CM | POA: Insufficient documentation

## 2017-01-29 DIAGNOSIS — E876 Hypokalemia: Secondary | ICD-10-CM | POA: Insufficient documentation

## 2017-01-29 DIAGNOSIS — F330 Major depressive disorder, recurrent, mild: Secondary | ICD-10-CM | POA: Diagnosis not present

## 2017-01-29 DIAGNOSIS — Z880 Allergy status to penicillin: Secondary | ICD-10-CM | POA: Insufficient documentation

## 2017-01-29 DIAGNOSIS — Z882 Allergy status to sulfonamides status: Secondary | ICD-10-CM | POA: Diagnosis not present

## 2017-01-29 DIAGNOSIS — G8929 Other chronic pain: Secondary | ICD-10-CM

## 2017-01-29 DIAGNOSIS — I10 Essential (primary) hypertension: Secondary | ICD-10-CM | POA: Insufficient documentation

## 2017-01-29 DIAGNOSIS — J439 Emphysema, unspecified: Secondary | ICD-10-CM

## 2017-01-29 DIAGNOSIS — M25511 Pain in right shoulder: Secondary | ICD-10-CM | POA: Insufficient documentation

## 2017-01-29 DIAGNOSIS — F418 Other specified anxiety disorders: Secondary | ICD-10-CM | POA: Insufficient documentation

## 2017-01-29 DIAGNOSIS — J449 Chronic obstructive pulmonary disease, unspecified: Secondary | ICD-10-CM | POA: Diagnosis not present

## 2017-01-29 DIAGNOSIS — M79605 Pain in left leg: Secondary | ICD-10-CM | POA: Insufficient documentation

## 2017-01-29 DIAGNOSIS — J302 Other seasonal allergic rhinitis: Secondary | ICD-10-CM

## 2017-01-29 DIAGNOSIS — Z888 Allergy status to other drugs, medicaments and biological substances status: Secondary | ICD-10-CM | POA: Diagnosis not present

## 2017-01-29 DIAGNOSIS — G43009 Migraine without aura, not intractable, without status migrainosus: Secondary | ICD-10-CM | POA: Insufficient documentation

## 2017-01-29 DIAGNOSIS — K219 Gastro-esophageal reflux disease without esophagitis: Secondary | ICD-10-CM | POA: Diagnosis not present

## 2017-01-29 DIAGNOSIS — R634 Abnormal weight loss: Secondary | ICD-10-CM

## 2017-01-29 MED ORDER — MONTELUKAST SODIUM 10 MG PO TABS: 10.0000 mg | ORAL_TABLET | Freq: Every day | ORAL | 2 refills | Status: DC

## 2017-01-29 MED ORDER — TOPIRAMATE 50 MG PO TABS: 50.0000 mg | ORAL_TABLET | Freq: Two times a day (BID) | ORAL | 3 refills | Status: DC

## 2017-01-29 MED ORDER — GABAPENTIN 300 MG PO CAPS: 600.0000 mg | ORAL_CAPSULE | Freq: Three times a day (TID) | ORAL | 3 refills | Status: AC

## 2017-01-29 MED ORDER — ATORVASTATIN CALCIUM 40 MG PO TABS: 40.0000 mg | ORAL_TABLET | Freq: Every day | ORAL | 3 refills | Status: DC

## 2017-01-29 MED ORDER — TRAZODONE HCL 100 MG PO TABS: 100.0000 mg | ORAL_TABLET | Freq: Every evening | ORAL | 2 refills | Status: DC | PRN

## 2017-01-29 MED ORDER — POTASSIUM CHLORIDE CRYS ER 20 MEQ PO TBCR: 20.0000 meq | EXTENDED_RELEASE_TABLET | Freq: Every day | ORAL | 3 refills | Status: DC

## 2017-01-29 MED ORDER — AMLODIPINE BESYLATE 10 MG PO TABS: 10.0000 mg | ORAL_TABLET | Freq: Every day | ORAL | 3 refills | Status: DC

## 2017-01-29 MED ORDER — DULOXETINE HCL 60 MG PO CPEP: 60.0000 mg | ORAL_CAPSULE | Freq: Every day | ORAL | 3 refills | Status: AC

## 2017-01-29 MED ORDER — ALBUTEROL SULFATE HFA 108 (90 BASE) MCG/ACT IN AERS: 1.0000 | INHALATION_SPRAY | Freq: Four times a day (QID) | RESPIRATORY_TRACT | 3 refills | Status: DC | PRN

## 2017-01-29 MED ORDER — BUDESONIDE-FORMOTEROL FUMARATE 160-4.5 MCG/ACT IN AERO: 2.0000 | INHALATION_SPRAY | Freq: Two times a day (BID) | RESPIRATORY_TRACT | 3 refills | Status: DC

## 2017-01-29 MED ORDER — TIOTROPIUM BROMIDE MONOHYDRATE 18 MCG IN CAPS: 18.0000 ug | ORAL_CAPSULE | Freq: Every day | RESPIRATORY_TRACT | 3 refills | Status: DC

## 2017-01-29 NOTE — Patient Instructions (Signed)

## 2017-01-29 NOTE — Progress Notes (Signed)
Subjective:  Patient ID: Lisa Pham, female    DOB: 01-Jul-1956  Age: 61 y.o. MRN: [id number]  CC: Hypertension; Depression; Anxiety; Fatigue; Chills; Anorexia; Shoulder Pain (right sided- been in bed for the last three days); and Weight Loss   HPI Lisa Pham is a 61 year old female with a history of hypertension, COPD, seasonal allergies, GERD, depression, migraines, hyperlipidemia who comes into the clinic for a Follow-up visit.  She complains of fatigue and states she feels sleepy today; afraid to sleep due to the neighborhood she lives in an so stays awake at night and takes a trazodone in the morning so she can sleep during the day. Also complains of depression and has not been taking her Effexor; depression is worsened by living situation. Denies suicidal ideation or intents  She has lost about 12 pounds since her last visit 3 months ago; she is afraid to eat due to diarrhea and soft stools and so has restricted her diet.Last TSH from 11/2015 was mildly suppressed.   Review of her chart indicates she was prescribed Bentyl which she takes daily rather than 3 times daily. She has a history of diverticulitis but denies blood in stools, she does not have abdominal pain, nausea or vomiting. Was seen by [facility] in 06/2016.  She complains of fever, chills, sweats for the last 1 month. Fever is subjective but then she goes on to tell me she stays cold.  Past Medical History:  Diagnosis Date  . Alcohol withdrawal ([place])   . Alcoholism ([place])   . Anxiety   . Arthritis    "all over" (05/14/2015)  . Asthma   . B12 deficiency anemia   . Chronic lower back pain   . Colon polyps   . COPD (chronic obstructive pulmonary disease) ([place])   . Depression   . Diverticulosis   . Gastric ulcer   . GERD (gastroesophageal reflux disease)   . Heart murmur   . High cholesterol   . History of stomach ulcers   . Hyperparathyroidism ([place])   . Hypertension   . Migraine    "3-4 times/wk or more"  (05/14/2015)  . Pancreatitis   . Pneumonia ~ 2013    Past Surgical History:  Procedure Laterality Date  . ABDOMINAL HYSTERECTOMY    . EXCISIONAL HEMORRHOIDECTOMY  ~ 1970  . FRACTURE SURGERY    . THUMB FUSION Bilateral ~ 2010   "took something from wrist/forearm & put it in my thumb"  . TIBIA FRACTURE SURGERY Left ~ 2000  . TUBAL LIGATION      Allergies  Allergen Reactions  . Other Hives  . Latex Hives and Itching  . Lisinopril Rash  . Nicotine Rash    ADHESIVE ON PATCH The adhesive in nicotine patch causes her to break out.  Marland Kitchen Penicillin G Rash  . Penicillins Other (See Comments) and Rash    Yeast infection  . Sulfa Antibiotics Rash  . Tape Rash     Outpatient Medications Prior to Visit  Medication Sig Dispense Refill  . aspirin-acetaminophen-caffeine (EXCEDRIN MIGRAINE) 250-250-65 MG per tablet Take 2 tablets by mouth every 6 (six) hours as needed for migraine.    . clotrimazole (LOTRIMIN) 1 % cream Apply 1 application topically 2 (two) times daily. 45 g 1  . dicyclomine (BENTYL) 10 MG capsule TAKE 1-2 CAPSULES BY MOUTH EVERY 8 HOURS. 60 capsule 3  . fluticasone (FLONASE) 50 MCG/ACT nasal spray USE 1 SPRAY IN EACH NOSTRIL DAILY 16 g 2  . naproxen (  NAPROSYN) 500 MG tablet Take 1 tablet (500 mg total) by mouth 2 (two) times daily with a meal. 30 tablet 1  . nystatin (NYSTATIN) powder May use twice daily under breast 30 g 1  . pantoprazole (PROTONIX) 40 MG tablet Take 1 tablet (40 mg total) by mouth daily. 30 tablet 3  . ranitidine (ZANTAC) 150 MG tablet TAKE 1-2 TABLETS BY MOUTH DAILY. 180 tablet 3  . albuterol (PROVENTIL HFA;VENTOLIN HFA) 108 (90 Base) MCG/ACT inhaler Inhale 1-2 puffs into the lungs every 6 (six) hours as needed for wheezing or shortness of breath. 1 Inhaler 2  . amLODipine (NORVASC) 10 MG tablet Take 1 tablet (10 mg total) by mouth daily. 30 tablet 2  . atorvastatin (LIPITOR) 40 MG tablet TAKE 1 TABLET BY MOUTH DAILY 30 tablet 2  . budesonide-formoterol  (SYMBICORT) 160-4.5 MCG/ACT inhaler Inhale 2 puffs into the lungs 2 (two) times daily. 1 Inhaler 3  . gabapentin (NEURONTIN) 300 MG capsule Take 2 capsules (600 mg total) by mouth 3 (three) times daily. 180 capsule 2  . montelukast (SINGULAIR) 10 MG tablet Take 1 tablet (10 mg total) by mouth at bedtime. 30 tablet 2  . potassium chloride SA (K-DUR,KLOR-CON) 20 MEQ tablet Take 1 tablet (20 mEq total) by mouth daily. 30 tablet 3  . tiotropium (SPIRIVA) 18 MCG inhalation capsule Place 1 capsule (18 mcg total) into inhaler and inhale daily. 30 capsule 2  . topiramate (TOPAMAX) 50 MG tablet Take 1 tablet (50 mg total) by mouth 2 (two) times daily. 60 tablet 2  . traZODone (DESYREL) 100 MG tablet Take 1 tablet (100 mg total) by mouth at bedtime as needed for sleep. 30 tablet 2  . vitamin B-12 (CYANOCOBALAMIN) 1000 MCG tablet Take 1,000 mcg by mouth daily.    Marland Kitchen venlafaxine (EFFEXOR) 25 MG tablet TAKE 1 TABLET TWICE A DAY (Patient not taking: Reported on 01/29/2017) 60 tablet 2   No facility-administered medications prior to visit.     ROS Review of Systems Constitutional: Negative for activity change, positive for appetite change and fatigue.  HENT: Negative for congestion, sinus pressure and sore throat.   Eyes: Negative for visual disturbance.  Respiratory: Negative for cough, chest tightness, shortness of breath and wheezing.   Cardiovascular: Negative for chest pain and palpitations.  Gastrointestinal: Negative for abdominal distention, abdominal pain and constipation.  Endocrine: Negative for polydipsia.  Genitourinary: Negative for dysuria and frequency.  Musculoskeletal: positive for chronic back pain  Skin: Negative.  Neurological: Negative for tremors, light-headedness and numbness.  Hematological: Does not bruise/bleed easily.  Psychiatric/Behavioral: see Hpi.   Objective:  BP 136/89 (BP Location: Right Arm, Patient Position: Sitting, Cuff Size: Small)   Pulse 76   Temp 98.4 F  (36.9 C) (Oral)   Ht _0  (1.626 m)   Wt 142 lb (64.4 kg)   SpO2 99%   BMI 24.37 kg/m   BP/Weight 01/29/2017 4/00/8676 10/28/5091  Systolic BP 267 124 580  Diastolic BP 89 90 83  Wt. (Lbs) 142 154 154.8  BMI 24.37 26.43 26.57      Physical Exam Constitutional: She is oriented to person, place, and time. She appears well-developed and well-nourished.  Cardiovascular: Normal rate and intact distal pulses.   Murmur heard. 1+ non pitting left ankle edema Pulmonary/Chest: Effort normal and breath sounds normal. She has no wheezes. She has no rales.   Abdominal: Soft. Bowel sounds are normal. She exhibits no distension and no mass. There is no tenderness.  Musculoskeletal: She  exhibits tenderness (tenderness on range of motion of right shoulder). tenderness on palpation of inferior third of left leg Neurological: She is alert and oriented to person, place, and time.  Psych; low mood  CMP Latest Ref Rng & Units 09/24/2016 07/13/2016 06/24/2016  Glucose 65 - 99 mg/dL 97 104(H) 93  BUN 7 - 25 mg/dL 6(L) 8 11  Creatinine 0.50 - 0.99 mg/dL 0.70 0.64 0.69  Sodium 135 - 146 mmol/L 143 144 146(H)  Potassium 3.5 - 5.3 mmol/L 3.8 3.1(L) 3.2(L)  Chloride 98 - 110 mmol/L 105 109 110  CO2 20 - 31 mmol/L _0 Calcium 8.6 - 10.4 mg/dL 9.8 9.4 8.6  Total Protein 6.0 - 8.3 g/dL - - 6.9  Total Bilirubin 0.2 - 1.2 mg/dL - - 0.4  Alkaline Phos 39 - 117 U/L - - 94  AST 0 - 37 U/L - - 35  ALT 0 - 35 U/L - - 24    Lipid Panel     Component Value Date/Time   CHOL 209 (H) 12/11/2015 1118   TRIG 151 (H) 12/11/2015 1118   HDL 78 12/11/2015 1118   CHOLHDL 2.7 12/11/2015 1118   VLDL 30 12/11/2015 1118   LDLCALC 101 12/11/2015 1118    Lab Results  Component Value Date   TSH 0.31 (L) 12/11/2015     Assessment & Plan:   1. Mild episode of recurrent major depressive disorder ([place]) Uncontrolled Worsened by current living situation Placed on Cymbalta which will help with both pain and  depression - DULoxetine (CYMBALTA) 60 MG capsule; Take 1 capsule (60 mg total) by mouth daily.  Dispense: 30 capsule; Refill: 3 - traZODone (DESYREL) 100 MG tablet; Take 1 tablet (100 mg total) by mouth at bedtime as needed for sleep.  Dispense: 30 tablet; Refill: 2  2. Migraine without aura and without status migrainosus, not intractable Controlled - topiramate (TOPAMAX) 50 MG tablet; Take 1 tablet (50 mg total) by mouth 2 (two) times daily.  Dispense: 60 tablet; Refill: 3  3. Pulmonary emphysema, unspecified emphysema type (HCC) Stable - tiotropium (SPIRIVA) 18 MCG inhalation capsule; Place 1 capsule (18 mcg total) into inhaler and inhale daily.  Dispense: 30 capsule; Refill: 3 - budesonide-formoterol (SYMBICORT) 160-4.5 MCG/ACT inhaler; Inhale 2 puffs into the lungs 2 (two) times daily.  Dispense: 1 Inhaler; Refill: 3 - albuterol (PROVENTIL HFA;VENTOLIN HFA) 108 (90 Base) MCG/ACT inhaler; Inhale 1-2 puffs into the lungs every 6 (six) hours as needed for wheezing or shortness of breath.  Dispense: 1 Inhaler; Refill: 3  4. Hypokalemia - potassium chloride SA (K-DUR,KLOR-CON) 20 MEQ tablet; Take 1 tablet (20 mEq total) by mouth daily.  Dispense: 30 tablet; Refill: 3  5. Chronic seasonal allergic rhinitis due to other allergen Stable - montelukast (SINGULAIR) 10 MG tablet; Take 1 tablet (10 mg total) by mouth at bedtime.  Dispense: 30 tablet; Refill: 2  6. Pure hypercholesterolemia Low cholesterol diet - Lipid panel - atorvastatin (LIPITOR) 40 MG tablet; Take 1 tablet (40 mg total) by mouth daily.  Dispense: 30 tablet; Refill: 3  7. Essential hypertension Controlled - CMP14+EGFR - amLODipine (NORVASC) 10 MG tablet; Take 1 tablet (10 mg total) by mouth daily.  Dispense: 30 tablet; Refill: 3  8. Weight loss Could be secondary to poor appetite Will evaluate for metabolic causes - last TSH was low - T4, free - TSH - CBC with Differential/Platelet  9. Leg pain, diffuse, left Previous  history of surgery - DG Tibia/Fibula Left; Future  10. Chronic midline low back pain without sciatica - gabapentin (NEURONTIN) 300 MG capsule; Take 2 capsules (600 mg total) by mouth 3 (three) times daily.  Dispense: 180 capsule; Refill: 3  11. Diverticulitis of intestine without perforation or abscess without bleeding, unspecified part of intestinal tract No acute flare is She continues to have loose stools and has been restricting her diet; advised to increase her dose of Bentyl as she has only been taking it once daily.   Meds ordered this encounter  Medications  . DULoxetine (CYMBALTA) 60 MG capsule    Sig: Take 1 capsule (60 mg total) by mouth daily.    Dispense:  30 capsule    Refill:  3    Discontinue Effexor  . traZODone (DESYREL) 100 MG tablet    Sig: Take 1 tablet (100 mg total) by mouth at bedtime as needed for sleep.    Dispense:  30 tablet    Refill:  2  . topiramate (TOPAMAX) 50 MG tablet    Sig: Take 1 tablet (50 mg total) by mouth 2 (two) times daily.    Dispense:  60 tablet    Refill:  3  . tiotropium (SPIRIVA) 18 MCG inhalation capsule    Sig: Place 1 capsule (18 mcg total) into inhaler and inhale daily.    Dispense:  30 capsule    Refill:  3  . potassium chloride SA (K-DUR,KLOR-CON) 20 MEQ tablet    Sig: Take 1 tablet (20 mEq total) by mouth daily.    Dispense:  30 tablet    Refill:  3  . montelukast (SINGULAIR) 10 MG tablet    Sig: Take 1 tablet (10 mg total) by mouth at bedtime.    Dispense:  30 tablet    Refill:  2  . gabapentin (NEURONTIN) 300 MG capsule    Sig: Take 2 capsules (600 mg total) by mouth 3 (three) times daily.    Dispense:  180 capsule    Refill:  3  . budesonide-formoterol (SYMBICORT) 160-4.5 MCG/ACT inhaler    Sig: Inhale 2 puffs into the lungs 2 (two) times daily.    Dispense:  1 Inhaler    Refill:  3  . atorvastatin (LIPITOR) 40 MG tablet    Sig: Take 1 tablet (40 mg total) by mouth daily.    Dispense:  30 tablet    Refill:  3    . amLODipine (NORVASC) 10 MG tablet    Sig: Take 1 tablet (10 mg total) by mouth daily.    Dispense:  30 tablet    Refill:  3    Discontinue Lisinopril  . albuterol (PROVENTIL HFA;VENTOLIN HFA) 108 (90 Base) MCG/ACT inhaler    Sig: Inhale 1-2 puffs into the lungs every 6 (six) hours as needed for wheezing or shortness of breath.    Dispense:  1 Inhaler    Refill:  3    Follow-up: Return in about 3 months (around 04/30/2017) for follow Up on chronic medical conditions.   Arnoldo Morale MD

## 2017-01-29 NOTE — Progress Notes (Signed)
Patient is sleeping during the day and is awake all night because she states that it is very dangerous where she lives and doesn't want to be asleep in case something happens.  Her only friend she has in the building is leaving.

## 2017-01-30 LAB — CBC WITH DIFFERENTIAL/PLATELET
BASOS: 1 %
Basophils Absolute: 0 10*3/uL (ref 0.0–0.2)
EOS (ABSOLUTE): 0.1 10*3/uL (ref 0.0–0.4)
Eos: 2 %
Hematocrit: 42.6 % (ref 34.0–46.6)
Hemoglobin: 13.8 g/dL (ref 11.1–15.9)
IMMATURE GRANULOCYTES: 0 %
Immature Grans (Abs): 0 10*3/uL (ref 0.0–0.1)
Lymphocytes Absolute: 2.4 10*3/uL (ref 0.7–3.1)
Lymphs: 38 %
MCH: 30.2 pg (ref 26.6–33.0)
MCHC: 32.4 g/dL (ref 31.5–35.7)
MCV: 93 fL (ref 79–97)
MONOS ABS: 0.4 10*3/uL (ref 0.1–0.9)
Monocytes: 7 %
NEUTROS PCT: 52 %
Neutrophils Absolute: 3.3 10*3/uL (ref 1.4–7.0)
PLATELETS: 243 10*3/uL (ref 150–379)
RBC: 4.57 x10E6/uL (ref 3.77–5.28)
RDW: 15.9 % — AB (ref 12.3–15.4)
WBC: 6.2 10*3/uL (ref 3.4–10.8)

## 2017-01-30 LAB — LIPID PANEL
CHOL/HDL RATIO: 1.7 ratio (ref 0.0–4.4)
CHOLESTEROL TOTAL: 226 mg/dL — AB (ref 100–199)
HDL: 132 mg/dL (ref >39)
LDL CALC: 69 mg/dL (ref 0–99)
TRIGLYCERIDES: 124 mg/dL (ref 0–149)
VLDL CHOLESTEROL CAL: 25 mg/dL (ref 5–40)

## 2017-01-30 LAB — CMP14+EGFR
A/G RATIO: 1.9 (ref 1.2–2.2)
ALBUMIN: 4.9 g/dL — AB (ref 3.6–4.8)
ALT: 23 IU/L (ref 0–32)
AST: 40 IU/L (ref 0–40)
Alkaline Phosphatase: 102 IU/L (ref 39–117)
BUN/Creatinine Ratio: 9 — ABNORMAL LOW (ref 12–28)
BUN: 7 mg/dL — AB (ref 8–27)
Bilirubin Total: 1.2 mg/dL (ref 0.0–1.2)
CO2: 27 mmol/L (ref 18–29)
Calcium: 10.3 mg/dL (ref 8.7–10.3)
Chloride: 101 mmol/L (ref 96–106)
Creatinine, Ser: 0.76 mg/dL (ref 0.57–1.00)
GFR, EST AFRICAN AMERICAN: 97 mL/min/{1.73_m2} (ref >59)
GFR, EST NON AFRICAN AMERICAN: 84 mL/min/{1.73_m2} (ref >59)
Globulin, Total: 2.6 g/dL (ref 1.5–4.5)
Glucose: 125 mg/dL — ABNORMAL HIGH (ref 65–99)
POTASSIUM: 3.1 mmol/L — AB (ref 3.5–5.2)
Sodium: 146 mmol/L — ABNORMAL HIGH (ref 134–144)
TOTAL PROTEIN: 7.5 g/dL (ref 6.0–8.5)

## 2017-01-30 LAB — T4, FREE: Free T4: 1.11 ng/dL (ref 0.82–1.77)

## 2017-01-30 LAB — TSH: TSH: 0.796 u[IU]/mL (ref 0.450–4.500)

## 2017-02-01 ENCOUNTER — Telehealth: Payer: Self-pay | Admitting: *Deleted

## 2017-02-01 ENCOUNTER — Other Ambulatory Visit: Payer: Self-pay | Admitting: Family Medicine

## 2017-02-01 DIAGNOSIS — E876 Hypokalemia: Secondary | ICD-10-CM

## 2017-02-01 MED ORDER — POTASSIUM CHLORIDE CRYS ER 15 MEQ PO TBCR: 30.0000 meq | EXTENDED_RELEASE_TABLET | Freq: Every day | ORAL | 3 refills | Status: DC

## 2017-02-01 NOTE — Telephone Encounter (Signed)
-----   Message from Jaclyn Shaggy, MD sent at 02/01/2017 10:10 AM EDT ----- She is hypokalemic and cholesterol is elevated. I have raised the dose of her potassium pill to ; advised to comply with low cholesterol diet and her statin.

## 2017-02-01 NOTE — Telephone Encounter (Signed)
Patient verified DOB Patient is aware of potassium level being low and cholesterol level being elevated. Patient aware of potassium dosage increased and advised to adhere to taking medications as prescribed. Patient expressed her understanding and had no further questions at this time.

## 2017-02-03 ENCOUNTER — Ambulatory Visit (HOSPITAL_BASED_OUTPATIENT_CLINIC_OR_DEPARTMENT_OTHER)
Admission: RE | Admit: 2017-02-03 | Discharge: 2017-02-03 | Disposition: A | Discharge status: Home / Self Care | Payer: Medicaid Other | Source: Ambulatory Visit | Attending: Family Medicine | Admitting: Family Medicine

## 2017-02-03 DIAGNOSIS — M79605 Pain in left leg: Secondary | ICD-10-CM | POA: Diagnosis present

## 2017-02-08 ENCOUNTER — Telehealth: Payer: Self-pay

## 2017-02-08 NOTE — Telephone Encounter (Signed)
Writer called patient and spoke with her about the xray she had on her left leg.  Patient stated understanding.

## 2017-02-08 NOTE — Telephone Encounter (Signed)
-----   Message from Jaclyn Shaggy, MD sent at 02/03/2017  4:58 PM EDT ----- Degenerative changes (arthritis)  noted on x-ray of the left leg as well as chronic postsurgical changes.

## 2017-02-11 ENCOUNTER — Ambulatory Visit
Admission: RE | Admit: 2017-02-11 | Discharge: 2017-02-11 | Disposition: A | Discharge status: Home / Self Care | Payer: Medicaid Other | Source: Ambulatory Visit | Attending: Family Medicine | Admitting: Family Medicine

## 2017-02-11 DIAGNOSIS — Z1239 Encounter for other screening for malignant neoplasm of breast: Secondary | ICD-10-CM

## 2017-02-17 ENCOUNTER — Telehealth: Payer: Self-pay | Admitting: Family Medicine

## 2017-02-17 ENCOUNTER — Telehealth: Payer: Self-pay

## 2017-02-17 NOTE — Telephone Encounter (Signed)
Writer LVM on patient's personal phone regarding her mammogram results being normal.  Patient encouraged to call with questions.

## 2017-02-17 NOTE — Telephone Encounter (Signed)
-----   Message from Jaclyn Shaggy, MD sent at 02/15/2017  4:26 PM EDT ----- Mammogram is negative for malignancy

## 2017-02-17 NOTE — Telephone Encounter (Signed)
Patient called back to review mammogram results, pt would like to discuss them. Pt states morning time is better for her if possible

## 2017-02-18 NOTE — Telephone Encounter (Signed)
Patient

## 2017-03-01 ENCOUNTER — Other Ambulatory Visit: Payer: Self-pay | Admitting: Family Medicine

## 2017-03-01 DIAGNOSIS — I10 Essential (primary) hypertension: Secondary | ICD-10-CM

## 2017-05-28 ENCOUNTER — Encounter: Payer: Self-pay | Admitting: Family Medicine

## 2017-05-28 ENCOUNTER — Encounter: Payer: Self-pay | Admitting: Pharmacist

## 2017-05-28 ENCOUNTER — Ambulatory Visit: Payer: Medicaid Other | Attending: Family Medicine | Admitting: Family Medicine

## 2017-05-28 VITALS — BP 117/77 | HR 68 | Temp 98.1°F | Ht 64.0 in | Wt 132.6 lb

## 2017-05-28 DIAGNOSIS — Z79899 Other long term (current) drug therapy: Secondary | ICD-10-CM | POA: Diagnosis not present

## 2017-05-28 DIAGNOSIS — G8929 Other chronic pain: Secondary | ICD-10-CM | POA: Diagnosis not present

## 2017-05-28 DIAGNOSIS — J439 Emphysema, unspecified: Secondary | ICD-10-CM

## 2017-05-28 DIAGNOSIS — G4700 Insomnia, unspecified: Secondary | ICD-10-CM | POA: Insufficient documentation

## 2017-05-28 DIAGNOSIS — M545 Low back pain: Secondary | ICD-10-CM | POA: Diagnosis not present

## 2017-05-28 DIAGNOSIS — E213 Hyperparathyroidism, unspecified: Secondary | ICD-10-CM | POA: Insufficient documentation

## 2017-05-28 DIAGNOSIS — F419 Anxiety disorder, unspecified: Secondary | ICD-10-CM | POA: Insufficient documentation

## 2017-05-28 DIAGNOSIS — F330 Major depressive disorder, recurrent, mild: Secondary | ICD-10-CM | POA: Insufficient documentation

## 2017-05-28 DIAGNOSIS — Z9114 Patient's other noncompliance with medication regimen: Secondary | ICD-10-CM | POA: Insufficient documentation

## 2017-05-28 DIAGNOSIS — I10 Essential (primary) hypertension: Secondary | ICD-10-CM | POA: Insufficient documentation

## 2017-05-28 DIAGNOSIS — K219 Gastro-esophageal reflux disease without esophagitis: Secondary | ICD-10-CM | POA: Insufficient documentation

## 2017-05-28 DIAGNOSIS — E7800 Pure hypercholesterolemia, unspecified: Secondary | ICD-10-CM | POA: Insufficient documentation

## 2017-05-28 DIAGNOSIS — Z880 Allergy status to penicillin: Secondary | ICD-10-CM | POA: Diagnosis not present

## 2017-05-28 DIAGNOSIS — Z7982 Long term (current) use of aspirin: Secondary | ICD-10-CM | POA: Insufficient documentation

## 2017-05-28 DIAGNOSIS — G43009 Migraine without aura, not intractable, without status migrainosus: Secondary | ICD-10-CM

## 2017-05-28 DIAGNOSIS — J449 Chronic obstructive pulmonary disease, unspecified: Secondary | ICD-10-CM | POA: Diagnosis not present

## 2017-05-28 DIAGNOSIS — E876 Hypokalemia: Secondary | ICD-10-CM | POA: Insufficient documentation

## 2017-05-28 DIAGNOSIS — R634 Abnormal weight loss: Secondary | ICD-10-CM | POA: Diagnosis not present

## 2017-05-28 MED ORDER — TIOTROPIUM BROMIDE MONOHYDRATE 18 MCG IN CAPS: 18.0000 ug | ORAL_CAPSULE | Freq: Every day | RESPIRATORY_TRACT | 3 refills | Status: AC

## 2017-05-28 MED ORDER — AMITRIPTYLINE HCL 10 MG PO TABS: 10.0000 mg | ORAL_TABLET | Freq: Every day | ORAL | 3 refills | Status: AC

## 2017-05-28 MED ORDER — BUDESONIDE-FORMOTEROL FUMARATE 160-4.5 MCG/ACT IN AERO: 2.0000 | INHALATION_SPRAY | Freq: Two times a day (BID) | RESPIRATORY_TRACT | 3 refills | Status: AC

## 2017-05-28 MED ORDER — POTASSIUM CHLORIDE CRYS ER 15 MEQ PO TBCR: 30.0000 meq | EXTENDED_RELEASE_TABLET | Freq: Every day | ORAL | 3 refills | Status: DC

## 2017-05-28 MED ORDER — MONTELUKAST SODIUM 10 MG PO TABS: 10.0000 mg | ORAL_TABLET | Freq: Every day | ORAL | 2 refills | Status: AC

## 2017-05-28 MED ORDER — AMLODIPINE BESYLATE 10 MG PO TABS: 10.0000 mg | ORAL_TABLET | Freq: Every day | ORAL | 3 refills | Status: AC

## 2017-05-28 MED ORDER — ALBUTEROL SULFATE HFA 108 (90 BASE) MCG/ACT IN AERS: 1.0000 | INHALATION_SPRAY | Freq: Four times a day (QID) | RESPIRATORY_TRACT | 3 refills | Status: AC | PRN

## 2017-05-28 MED ORDER — ATORVASTATIN CALCIUM 40 MG PO TABS: 40.0000 mg | ORAL_TABLET | Freq: Every day | ORAL | 3 refills | Status: AC

## 2017-05-28 NOTE — Progress Notes (Signed)
Received prior [place] [facility] [facility]. Completed and approved #[id number]

## 2017-05-28 NOTE — Progress Notes (Signed)
Subjective:  Patient ID: Lisa Pham, female    DOB: 18-Oct-1956  Age: 61 y.o. MRN: [id number]  CC: Follow-up   HPI Lisa Pham is a 61 year old female with a history of hypertension, COPD, seasonal allergies, GERD, depression, migraines, hyperlipidemia who comes into the clinic for a Follow-up visit.  She complains of fatigue, Insomnia and worsening migraine despite being on Topamax. This has been exacerbated by her new neighbors upstairs who are always stumping and this affects her sleep. It has made her more depressed but she doesn't take her Cymbalta which she was prescribed as she feels she takes too many medications. Currently on trazodone which she takes only as needed.  Denies suicidal ideation or intents Of note she had complained of prior living situations and was looking to improvement in her depression when she moved to this apparttment but that has not been the case.  She has lost about 22 pounds in the last 7 months and her last TSH was normal. She endorses a decreased appetite, last TSH from 01/2017 was normal.  She has a history of diverticulitis but denies blood in stools, she does not have abdominal pain, nausea or vomiting.  Chronic back pain is unchanged and she denies numbness in extremities. She endorses noncompliance with all her medications and takes them intermittently  Past Medical History:  Diagnosis Date  . Alcohol withdrawal ([place])   . Alcoholism ([place])   . Anxiety   . Arthritis    "all over" (05/14/2015)  . Asthma   . B12 deficiency anemia   . Chronic lower back pain   . Colon polyps   . COPD (chronic obstructive pulmonary disease) ([place])   . Depression   . Diverticulosis   . Gastric ulcer   . GERD (gastroesophageal reflux disease)   . Heart murmur   . High cholesterol   . History of stomach ulcers   . Hyperparathyroidism ([place])   . Hypertension   . Migraine    "3-4 times/wk or more" (05/14/2015)  . Pancreatitis   . Pneumonia ~ 2013     Past Surgical History:  Procedure Laterality Date  . ABDOMINAL HYSTERECTOMY    . EXCISIONAL HEMORRHOIDECTOMY  ~ 1970  . FRACTURE SURGERY    . THUMB FUSION Bilateral ~ 2010   "took something from wrist/forearm & put it in my thumb"  . TIBIA FRACTURE SURGERY Left ~ 2000  . TUBAL LIGATION      Allergies  Allergen Reactions  . Other Hives  . Latex Hives and Itching  . Lisinopril Rash  . Nicotine Rash    ADHESIVE ON PATCH The adhesive in nicotine patch causes her to break out.  Marland Kitchen Penicillin G Rash  . Penicillins Other (See Comments) and Rash    Yeast infection  . Sulfa Antibiotics Rash  . Tape Rash      Outpatient Medications Prior to Visit  Medication Sig Dispense Refill  . aspirin-acetaminophen-caffeine (EXCEDRIN MIGRAINE) 250-250-65 MG per tablet Take 2 tablets by mouth every 6 (six) hours as needed for migraine.    . clotrimazole (LOTRIMIN) 1 % cream Apply 1 application topically 2 (two) times daily. 45 g 1  . dicyclomine (BENTYL) 10 MG capsule TAKE 1-2 CAPSULES BY MOUTH EVERY 8 HOURS. 60 capsule 3  . fluticasone (FLONASE) 50 MCG/ACT nasal spray USE 1 SPRAY IN EACH NOSTRIL DAILY 16 g 2  . gabapentin (NEURONTIN) 300 MG capsule Take 2 capsules (600 mg total) by mouth 3 (three) times daily. 180 capsule 3  .  naproxen (NAPROSYN) 500 MG tablet Take 1 tablet (500 mg total) by mouth 2 (two) times daily with a meal. 30 tablet 1  . nystatin (NYSTATIN) powder May use twice daily under breast 30 g 1  . pantoprazole (PROTONIX) 40 MG tablet Take 1 tablet (40 mg total) by mouth daily. 30 tablet 3  . ranitidine (ZANTAC) 150 MG tablet TAKE 1-2 TABLETS BY MOUTH DAILY. 180 tablet 3  . albuterol (PROVENTIL HFA;VENTOLIN HFA) 108 (90 Base) MCG/ACT inhaler Inhale 1-2 puffs into the lungs every 6 (six) hours as needed for wheezing or shortness of breath. 1 Inhaler 3  . amLODipine (NORVASC) 10 MG tablet Take 1 tablet (10 mg total) by mouth daily. 30 tablet 3  . atorvastatin (LIPITOR) 40 MG  tablet Take 1 tablet (40 mg total) by mouth daily. 30 tablet 3  . budesonide-formoterol (SYMBICORT) 160-4.5 MCG/ACT inhaler Inhale 2 puffs into the lungs 2 (two) times daily. 1 Inhaler 3  . montelukast (SINGULAIR) 10 MG tablet Take 1 tablet (10 mg total) by mouth at bedtime. 30 tablet 2  . potassium chloride SA (KLOR-CON M15) 15 MEQ tablet Take 2 tablets (30 mEq total) by mouth daily. 60 tablet 3  . tiotropium (SPIRIVA) 18 MCG inhalation capsule Place 1 capsule (18 mcg total) into inhaler and inhale daily. 30 capsule 3  . topiramate (TOPAMAX) 50 MG tablet Take 1 tablet (50 mg total) by mouth 2 (two) times daily. 60 tablet 3  . traZODone (DESYREL) 100 MG tablet Take 1 tablet (100 mg total) by mouth at bedtime as needed for sleep. 30 tablet 2  . DULoxetine (CYMBALTA) 60 MG capsule Take 1 capsule (60 mg total) by mouth daily. (Patient not taking: Reported on 05/28/2017) 30 capsule 3  . vitamin B-12 (CYANOCOBALAMIN) 1000 MCG tablet Take 1,000 mcg by mouth daily.     No facility-administered medications prior to visit.     ROS Review of Systems Constitutional: Negative for activity change, positive for appetite change and fatigue.  HENT: Negative for congestion, sinus pressure and sore throat.   Eyes: Negative for visual disturbance.  Respiratory: Negative for cough, chest tightness, shortness of breath and wheezing.   Cardiovascular: Negative for chest pain and palpitations.  Gastrointestinal: Negative for abdominal distention, abdominal pain and constipation.  Endocrine: Negative for polydipsia.  Genitourinary: Negative for dysuria and frequency.  Musculoskeletal: positive for chronic back pain  Skin: Negative.  Neurological: Negative for tremors, light-headedness and numbness.  Hematological: Does not bruise/bleed easily.  Psychiatric/Behavioral: see Hpi.   Objective:  BP 117/77   Pulse 68   Temp 98.1 F (36.7 C) (Oral)   Ht _0  (1.626 m)   Wt 132 lb 9.6 oz (60.1 kg)   SpO2 98%    BMI 22.76 kg/m   BP/Weight 05/28/2017 01/29/2017 1/74/0814  Systolic BP 481 856 314  Diastolic BP 77 89 90  Wt. (Lbs) 132.6 142 154  BMI 22.76 24.37 26.43      Physical Exam Constitutional: She is oriented to person, place, and time. She appears well-developed and well-nourished.  Cardiovascular: Normal rate and intact distal pulses.   Murmur heard. 1+ non pitting left ankle edema Pulmonary/Chest: Effort normal and breath sounds normal. She has no wheezes. She has no rales.   Abdominal: Soft. Bowel sounds are normal. She exhibits no distension and no mass. There is no tenderness.  Musculoskeletal: She exhibits Slight lumbar spine tenderness  Neurological: She is alert and oriented to person, place, and time.  Psych; low mood  CMP Latest Ref Rng & Units 01/29/2017 09/24/2016 07/13/2016  Glucose 65 - 99 mg/dL 125(H) 97 104(H)  BUN 8 - 27 mg/dL 7(L) 6(L) 8  Creatinine 0.57 - 1.00 mg/dL 0.76 0.70 0.64  Sodium 134 - 144 mmol/L 146(H) 143 144  Potassium 3.5 - 5.2 mmol/L 3.1(L) 3.8 3.1(L)  Chloride 96 - 106 mmol/L 101 105 109  CO2 18 - 29 mmol/L _0 Calcium 8.7 - 10.3 mg/dL 10.3 9.8 9.4  Total Protein 6.0 - 8.5 g/dL 7.5 - -  Total Bilirubin 0.0 - 1.2 mg/dL 1.2 - -  Alkaline Phos 39 - 117 IU/L 102 - -  AST 0 - 40 IU/L 40 - -  ALT 0 - 32 IU/L 23 - -    Lipid Panel     Component Value Date/Time   CHOL 226 (H) 01/29/2017 1215   TRIG 124 01/29/2017 1215   HDL 132 01/29/2017 1215   CHOLHDL 1.7 01/29/2017 1215   CHOLHDL 2.7 12/11/2015 1118   VLDL 30 12/11/2015 1118   LDLCALC 69 01/29/2017 1215    Assessment & Plan:   1. Pure hypercholesterolemia Uncontrolled We'll make no regimen changes if lipids are still elevated as she has been noncompliant with her medications - atorvastatin (LIPITOR) 40 MG tablet; Take 1 tablet (40 mg total) by mouth daily.  Dispense: 30 tablet; Refill: 3 - Lipid panel  2. Hypokalemia Last potassium was 3.1 Potassium was recently increased at her  last ED visit and she is yet to commence new dose - potassium chloride SA (KLOR-CON M15) 15 MEQ tablet; Take 2 tablets (30 mEq total) by mouth daily.  Dispense: 60 tablet; Refill: 3 - CMP14+EGFR  3. Pulmonary emphysema, unspecified emphysema type (HCC) No acute flares - budesonide-formoterol (SYMBICORT) 160-4.5 MCG/ACT inhaler; Inhale 2 puffs into the lungs 2 (two) times daily.  Dispense: 1 Inhaler; Refill: 3 - tiotropium (SPIRIVA) 18 MCG inhalation capsule; Place 1 capsule (18 mcg total) into inhaler and inhale daily.  Dispense: 30 capsule; Refill: 3 - albuterol (PROVENTIL HFA;VENTOLIN HFA) 108 (90 Base) MCG/ACT inhaler; Inhale 1-2 puffs into the lungs every 6 (six) hours as needed for wheezing or shortness of breath.  Dispense: 1 Inhaler; Refill: 3  4. Mild episode of recurrent major depressive disorder (HCC) Left neck and crisis Exacerbated by poor living conditions Advised to take Cymbalta, which she has not been doing  5. Essential hypertension Controlled - amLODipine (NORVASC) 10 MG tablet; Take 1 tablet (10 mg total) by mouth daily.  Dispense: 30 tablet; Refill: 3  6. Weight loss Unknown etiology - could also be secondary to poor oral intake from being depressed Last TSH was normal Mammogram, colonoscopy negative for malignancy Scheduled for Pap smear Discontinue Topamax - CBC with Differential/Platelet - TSH  7. Migraine without aura and without status migrainosus, not intractable Placed on Elavil which will also help with insomnia Topamax discontinued due to weight loss  8. Chronic midline low back pain without sciatica She has naproxen and Cymbalta   Meds ordered this encounter  Medications  . atorvastatin (LIPITOR) 40 MG tablet    Sig: Take 1 tablet (40 mg total) by mouth daily.    Dispense:  30 tablet    Refill:  3  . potassium chloride SA (KLOR-CON M15) 15 MEQ tablet    Sig: Take 2 tablets (30 mEq total) by mouth daily.    Dispense:  60 tablet    Refill:  3     Discontinue previous dose  .  budesonide-formoterol (SYMBICORT) 160-4.5 MCG/ACT inhaler    Sig: Inhale 2 puffs into the lungs 2 (two) times daily.    Dispense:  1 Inhaler    Refill:  3  . tiotropium (SPIRIVA) 18 MCG inhalation capsule    Sig: Place 1 capsule (18 mcg total) into inhaler and inhale daily.    Dispense:  30 capsule    Refill:  3  . albuterol (PROVENTIL HFA;VENTOLIN HFA) 108 (90 Base) MCG/ACT inhaler    Sig: Inhale 1-2 puffs into the lungs every 6 (six) hours as needed for wheezing or shortness of breath.    Dispense:  1 Inhaler    Refill:  3  . montelukast (SINGULAIR) 10 MG tablet    Sig: Take 1 tablet (10 mg total) by mouth at bedtime.    Dispense:  30 tablet    Refill:  2  . amLODipine (NORVASC) 10 MG tablet    Sig: Take 1 tablet (10 mg total) by mouth daily.    Dispense:  30 tablet    Refill:  3    Discontinue Lisinopril  . amitriptyline (ELAVIL) 10 MG tablet    Sig: Take 1 tablet (10 mg total) by mouth at bedtime.    Dispense:  30 tablet    Refill:  3    Discontinue Topamax and Trazodone    Follow-up: Return in about 1 month (around 06/28/2017) for Pap smear.   This note has been created with Surveyor, quantity. Any transcriptional errors are unintentional.     Arnoldo Morale MD

## 2017-05-29 LAB — CMP14+EGFR
A/G RATIO: 2 (ref 1.2–2.2)
ALK PHOS: 71 IU/L (ref 39–117)
ALT: 17 IU/L (ref 0–32)
AST: 18 IU/L (ref 0–40)
Albumin: 4.3 g/dL (ref 3.6–4.8)
BILIRUBIN TOTAL: 0.3 mg/dL (ref 0.0–1.2)
BUN/Creatinine Ratio: 14 (ref 12–28)
BUN: 9 mg/dL (ref 8–27)
CALCIUM: 9.5 mg/dL (ref 8.7–10.3)
CHLORIDE: 105 mmol/L (ref 96–106)
CO2: 23 mmol/L (ref 20–29)
Creatinine, Ser: 0.65 mg/dL (ref 0.57–1.00)
GFR calc Af Amer: 110 mL/min/{1.73_m2} (ref >59)
GFR calc non Af Amer: 95 mL/min/{1.73_m2} (ref >59)
Globulin, Total: 2.2 g/dL (ref 1.5–4.5)
Glucose: 78 mg/dL (ref 65–99)
POTASSIUM: 3.4 mmol/L — AB (ref 3.5–5.2)
SODIUM: 147 mmol/L — AB (ref 134–144)
Total Protein: 6.5 g/dL (ref 6.0–8.5)

## 2017-05-29 LAB — LIPID PANEL
CHOLESTEROL TOTAL: 190 mg/dL (ref 100–199)
Chol/HDL Ratio: 1.8 ratio (ref 0.0–4.4)
HDL: 105 mg/dL (ref >39)
LDL CALC: 60 mg/dL (ref 0–99)
Triglycerides: 124 mg/dL (ref 0–149)
VLDL CHOLESTEROL CAL: 25 mg/dL (ref 5–40)

## 2017-05-29 LAB — CBC WITH DIFFERENTIAL/PLATELET
BASOS: 1 %
Basophils Absolute: 0 10*3/uL (ref 0.0–0.2)
EOS (ABSOLUTE): 0.1 10*3/uL (ref 0.0–0.4)
Eos: 2 %
Hematocrit: 39 % (ref 34.0–46.6)
Hemoglobin: 12.4 g/dL (ref 11.1–15.9)
IMMATURE GRANULOCYTES: 0 %
Immature Grans (Abs): 0 10*3/uL (ref 0.0–0.1)
LYMPHS ABS: 1.8 10*3/uL (ref 0.7–3.1)
Lymphs: 35 %
MCH: 30.5 pg (ref 26.6–33.0)
MCHC: 31.8 g/dL (ref 31.5–35.7)
MCV: 96 fL (ref 79–97)
MONOS ABS: 0.4 10*3/uL (ref 0.1–0.9)
Monocytes: 8 %
Neutrophils Absolute: 2.7 10*3/uL (ref 1.4–7.0)
Neutrophils: 54 %
PLATELETS: 230 10*3/uL (ref 150–379)
RBC: 4.06 x10E6/uL (ref 3.77–5.28)
RDW: 14.5 % (ref 12.3–15.4)
WBC: 5.1 10*3/uL (ref 3.4–10.8)

## 2017-05-29 LAB — TSH: TSH: 0.173 u[IU]/mL — ABNORMAL LOW (ref 0.450–4.500)

## 2017-06-01 ENCOUNTER — Telehealth: Payer: Self-pay

## 2017-06-01 NOTE — Telephone Encounter (Signed)
Pt was called and no VM was set up to leave a message. 

## 2017-06-02 ENCOUNTER — Telehealth: Payer: Self-pay

## 2017-06-02 NOTE — Telephone Encounter (Signed)
Pt was called and informed of lab results. 

## 2017-06-28 ENCOUNTER — Other Ambulatory Visit: Payer: Self-pay | Admitting: Family Medicine

## 2017-06-29 ENCOUNTER — Ambulatory Visit: Payer: Medicaid Other | Attending: Family Medicine | Admitting: Family Medicine

## 2017-06-29 ENCOUNTER — Other Ambulatory Visit (HOSPITAL_COMMUNITY)
Admission: RE | Admit: 2017-06-29 | Discharge: 2017-06-29 | Disposition: A | Discharge status: Home / Self Care | Payer: Medicaid Other | Source: Ambulatory Visit | Attending: Family Medicine | Admitting: Family Medicine

## 2017-06-29 ENCOUNTER — Encounter: Payer: Self-pay | Admitting: Family Medicine

## 2017-06-29 VITALS — BP 135/87 | HR 93 | Temp 97.5°F | Ht 64.0 in | Wt 130.2 lb

## 2017-06-29 DIAGNOSIS — Z01419 Encounter for gynecological examination (general) (routine) without abnormal findings: Secondary | ICD-10-CM | POA: Diagnosis present

## 2017-06-29 DIAGNOSIS — F419 Anxiety disorder, unspecified: Secondary | ICD-10-CM | POA: Insufficient documentation

## 2017-06-29 DIAGNOSIS — R946 Abnormal results of thyroid function studies: Secondary | ICD-10-CM | POA: Insufficient documentation

## 2017-06-29 DIAGNOSIS — Z23 Encounter for immunization: Secondary | ICD-10-CM | POA: Diagnosis not present

## 2017-06-29 DIAGNOSIS — K219 Gastro-esophageal reflux disease without esophagitis: Secondary | ICD-10-CM | POA: Diagnosis not present

## 2017-06-29 DIAGNOSIS — J449 Chronic obstructive pulmonary disease, unspecified: Secondary | ICD-10-CM | POA: Diagnosis not present

## 2017-06-29 DIAGNOSIS — F329 Major depressive disorder, single episode, unspecified: Secondary | ICD-10-CM | POA: Insufficient documentation

## 2017-06-29 DIAGNOSIS — Z79899 Other long term (current) drug therapy: Secondary | ICD-10-CM | POA: Diagnosis not present

## 2017-06-29 DIAGNOSIS — M1990 Unspecified osteoarthritis, unspecified site: Secondary | ICD-10-CM | POA: Insufficient documentation

## 2017-06-29 DIAGNOSIS — E876 Hypokalemia: Secondary | ICD-10-CM | POA: Insufficient documentation

## 2017-06-29 DIAGNOSIS — R7989 Other specified abnormal findings of blood chemistry: Secondary | ICD-10-CM

## 2017-06-29 DIAGNOSIS — I10 Essential (primary) hypertension: Secondary | ICD-10-CM | POA: Insufficient documentation

## 2017-06-29 DIAGNOSIS — Z0000 Encounter for general adult medical examination without abnormal findings: Secondary | ICD-10-CM

## 2017-06-29 DIAGNOSIS — R399 Unspecified symptoms and signs involving the genitourinary system: Secondary | ICD-10-CM

## 2017-06-29 NOTE — Patient Instructions (Signed)

## 2017-06-29 NOTE — Progress Notes (Signed)
Subjective:  Patient ID: Lisa Pham, female    DOB: [birth year]  Age: 61 y.o. MRN: [id number]  CC: Gynecologic Exam  HPI Lisa Pham presents for gynecological exam today. Complains of a foul smell every time she urinates that started a couple of days ago. Denies burning upon urination. Denies being sexual active in the last year.  Also has not received the influneza vaccine this year, but would like to.   Additionally, last labs revealed low TSH and potassium levels.   Past Medical History:  Diagnosis Date  . Alcohol withdrawal ([facility])   . Alcoholism ([facility])   . Anxiety   . Arthritis    "all over" (05/14/2015)  . Asthma   . B12 deficiency anemia   . Chronic lower back pain   . Colon polyps   . COPD (chronic obstructive pulmonary disease) ([facility])   . Depression   . Diverticulosis   . Gastric ulcer   . GERD (gastroesophageal reflux disease)   . Heart murmur   . High cholesterol   . History of stomach ulcers   . Hyperparathyroidism ([facility])   . Hypertension   . Migraine    "3-4 times/wk or more" (05/14/2015)  . Pancreatitis   . Pneumonia ~ 2013   Past Surgical History:  Procedure Laterality Date  . ABDOMINAL HYSTERECTOMY    . EXCISIONAL HEMORRHOIDECTOMY  ~ 1970  . FRACTURE SURGERY    . THUMB FUSION Bilateral ~ 2010   "took something from wrist/forearm & put it in my thumb"  . TIBIA FRACTURE SURGERY Left ~ 2000  . TUBAL LIGATION      Outpatient Medications Prior to Visit  Medication Sig Dispense Refill  . albuterol (PROVENTIL HFA;VENTOLIN HFA) 108 (90 Base) MCG/ACT inhaler Inhale 1-2 puffs into the lungs every 6 (six) hours as needed for wheezing or shortness of breath. 1 Inhaler 3  . amitriptyline (ELAVIL) 10 MG tablet Take 1 tablet (10 mg total) by mouth at bedtime. 30 tablet 3  . amLODipine (NORVASC) 10 MG tablet Take 1 tablet (10 mg total) by mouth daily. 30 tablet 3  . aspirin-acetaminophen-caffeine (EXCEDRIN MIGRAINE) 250-250-65 MG per tablet Take 2 tablets by  mouth every 6 (six) hours as needed for migraine.    Marland Kitchen atorvastatin (LIPITOR) 40 MG tablet Take 1 tablet (40 mg total) by mouth daily. 30 tablet 3  . budesonide-formoterol (SYMBICORT) 160-4.5 MCG/ACT inhaler Inhale 2 puffs into the lungs 2 (two) times daily. 1 Inhaler 3  . clotrimazole (LOTRIMIN) 1 % cream Apply 1 application topically 2 (two) times daily. 45 g 1  . dicyclomine (BENTYL) 10 MG capsule TAKE 1-2 CAPSULES BY MOUTH EVERY 8 HOURS. 60 capsule 3  . DULoxetine (CYMBALTA) 60 MG capsule Take 1 capsule (60 mg total) by mouth daily. 30 capsule 3  . fluticasone (FLONASE) 50 MCG/ACT nasal spray USE 1 SPRAY IN EACH NOSTRIL DAILY 16 g 2  . gabapentin (NEURONTIN) 300 MG capsule Take 2 capsules (600 mg total) by mouth 3 (three) times daily. 180 capsule 3  . montelukast (SINGULAIR) 10 MG tablet Take 1 tablet (10 mg total) by mouth at bedtime. 30 tablet 2  . naproxen (NAPROSYN) 500 MG tablet Take 1 tablet (500 mg total) by mouth 2 (two) times daily with a meal. 30 tablet 1  . nystatin (NYSTATIN) powder May use twice daily under breast 30 g 1  . pantoprazole (PROTONIX) 40 MG tablet Take 1 tablet (40 mg total) by mouth daily. 30 tablet 3  . potassium chloride  SA (KLOR-CON M15) 15 MEQ tablet Take 2 tablets (30 mEq total) by mouth daily. 60 tablet 3  . ranitidine (ZANTAC) 150 MG tablet TAKE 1-2 TABLETS BY MOUTH DAILY. 180 tablet 3  . tiotropium (SPIRIVA) 18 MCG inhalation capsule Place 1 capsule (18 mcg total) into inhaler and inhale daily. 30 capsule 3  . vitamin B-12 (CYANOCOBALAMIN) 1000 MCG tablet Take 1,000 mcg by mouth daily.     No facility-administered medications prior to visit.     ROS Review of Systems  Constitutional: Positive for appetite change and unexpected weight change. Negative for activity change and fatigue.  HENT: Negative for congestion, sinus pressure and sore throat.   Eyes: Negative for visual disturbance.  Respiratory: Negative for cough, chest tightness, shortness of  breath and wheezing.   Cardiovascular: Negative for chest pain and palpitations.  Gastrointestinal: Negative for abdominal distention, abdominal pain and constipation.  Endocrine: Negative for polydipsia.  Genitourinary: Positive for difficulty urinating. Negative for dysuria and frequency.  Musculoskeletal: Negative for arthralgias and back pain.  Skin: Negative for rash.  Neurological: Negative for tremors, light-headedness and numbness.  Hematological: Does not bruise/bleed easily.  Psychiatric/Behavioral: Negative for agitation and behavioral problems.    Objective:  BP 135/87   Pulse 93   Temp (!) 97.5 F (36.4 C) (Oral)   Ht 5\' 4"  (1.626 m)   Wt 130 lb 3.2 oz (59.1 kg)   SpO2 100%   BMI 22.35 kg/m   BP/Weight 06/29/2017 05/28/2017 01/29/2017  Systolic BP 135 117 136  Diastolic BP 87 77 89  Wt. (Lbs) 130.2 132.6 142  BMI 22.35 22.76 24.37     Physical Exam  Constitutional: She appears well-developed. No distress.  Additional weight loss of 2lbs.   Cardiovascular: Normal rate and regular rhythm.   Pulmonary/Chest: Breath sounds normal. She has no wheezes.  Abdominal: Soft. There is no tenderness.  Genitourinary: Vagina normal.  Genitourinary Comments: External genitalia intact. Vaginal walls pink with slight white discharge. Cervix was not visualized     Assessment & Plan:   1. Abnormal TSH -  Repeat TSH  2. Health care maintenance No pap smear needed, history of hysterectomy  3. Urinary symptom or sign - Cervicovaginal ancillary only/ UA if patient able to provide sample  4. Need for influenza vaccination - Flu Vaccine QUAD 36+ mos IM  5. Hypokalemia - Repeat Basic Metabolic Panel    Follow-up: Return in about 3 months (around 09/28/2017) for Follow-up of chronic medical conditions.

## 2017-06-30 ENCOUNTER — Other Ambulatory Visit: Payer: Self-pay | Admitting: Family Medicine

## 2017-06-30 DIAGNOSIS — E876 Hypokalemia: Secondary | ICD-10-CM

## 2017-06-30 LAB — BASIC METABOLIC PANEL
BUN/Creatinine Ratio: 17 (ref 12–28)
BUN: 20 mg/dL (ref 8–27)
CHLORIDE: 98 mmol/L (ref 96–106)
CO2: 20 mmol/L (ref 20–29)
Calcium: 10.8 mg/dL — ABNORMAL HIGH (ref 8.7–10.3)
Creatinine, Ser: 1.19 mg/dL — ABNORMAL HIGH (ref 0.57–1.00)
GFR calc Af Amer: 57 mL/min/{1.73_m2} — ABNORMAL LOW (ref >59)
GFR calc non Af Amer: 49 mL/min/{1.73_m2} — ABNORMAL LOW (ref >59)
GLUCOSE: 102 mg/dL — AB (ref 65–99)
POTASSIUM: 3.3 mmol/L — AB (ref 3.5–5.2)
Sodium: 141 mmol/L (ref 134–144)

## 2017-06-30 LAB — TSH: TSH: 1 u[IU]/mL (ref 0.450–4.500)

## 2017-06-30 LAB — CERVICOVAGINAL ANCILLARY ONLY
BACTERIAL VAGINITIS: NEGATIVE
CHLAMYDIA, DNA PROBE: NEGATIVE
Candida vaginitis: NEGATIVE
NEISSERIA GONORRHEA: NEGATIVE
TRICH (WINDOWPATH): NEGATIVE

## 2017-06-30 MED ORDER — POTASSIUM CHLORIDE CRYS ER 20 MEQ PO TBCR: 40.0000 meq | EXTENDED_RELEASE_TABLET | Freq: Every day | ORAL | 3 refills | Status: AC

## 2017-07-01 ENCOUNTER — Telehealth: Payer: Self-pay

## 2017-07-01 NOTE — Telephone Encounter (Signed)
Pt was called and there was no VM to leave a message. If pt returns phone call please inform pt that potassium is low and new script has been sent to pharmacy.

## 2017-08-19 DEATH — deceased

## 2017-09-30 ENCOUNTER — Ambulatory Visit: Payer: Self-pay | Admitting: Family Medicine

## 2018-04-09 IMAGING — DX DG TIBIA/FIBULA 2V*L*
4 series · 4 of 4 positions shown · non-contrast
Comparison: None.

CLINICAL DATA: Pain without injury, initial encounter

EXAM:
LEFT TIBIA AND FIBULA - 2 VIEW

[tibia ap (1 of 2)]
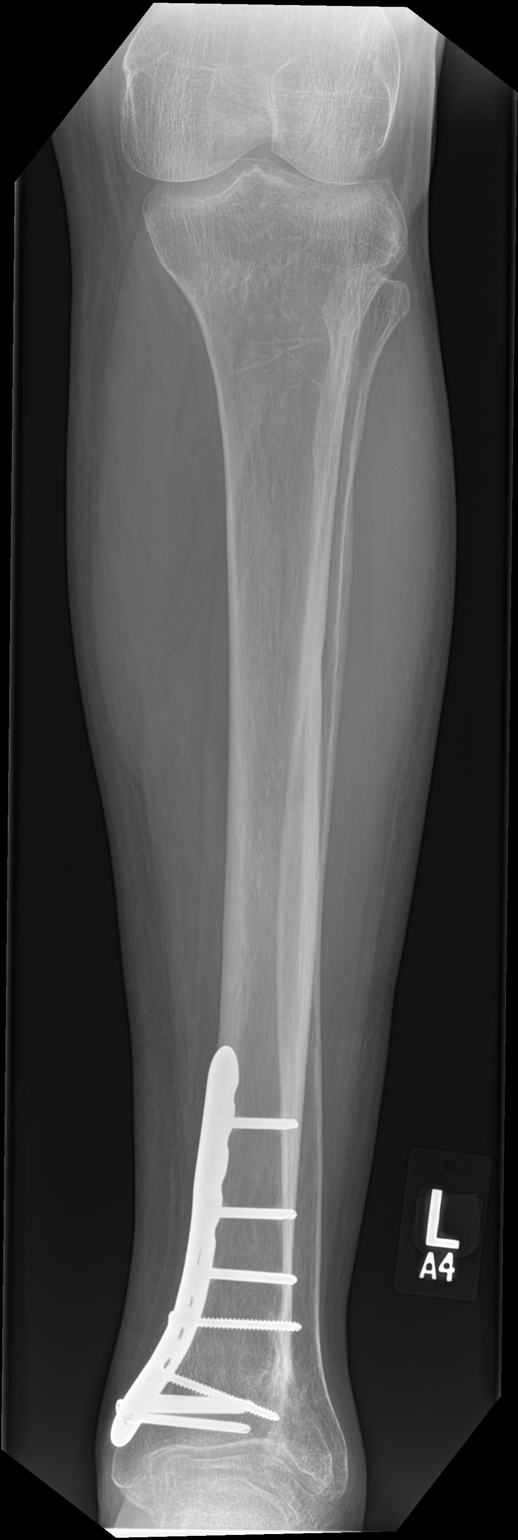

[tibia ap (2 of 2)]
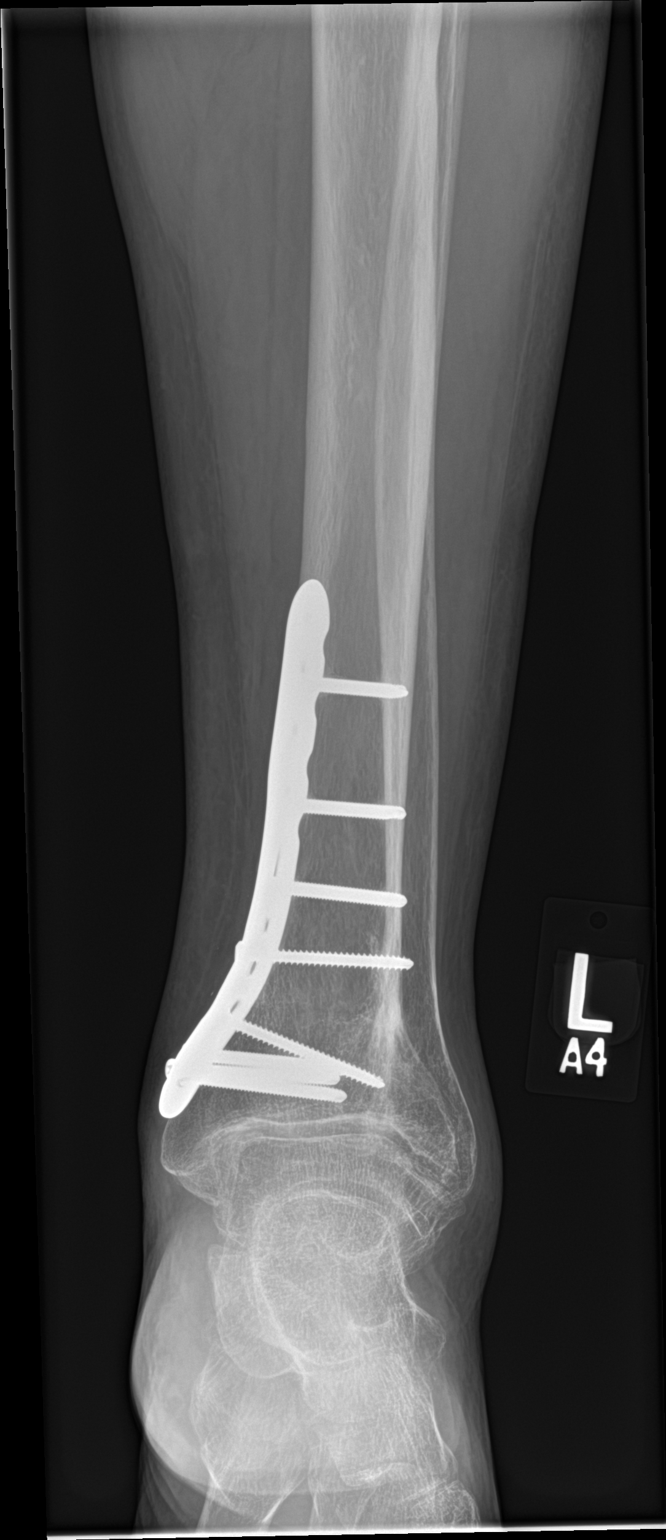

[tibia lat (1 of 2)]
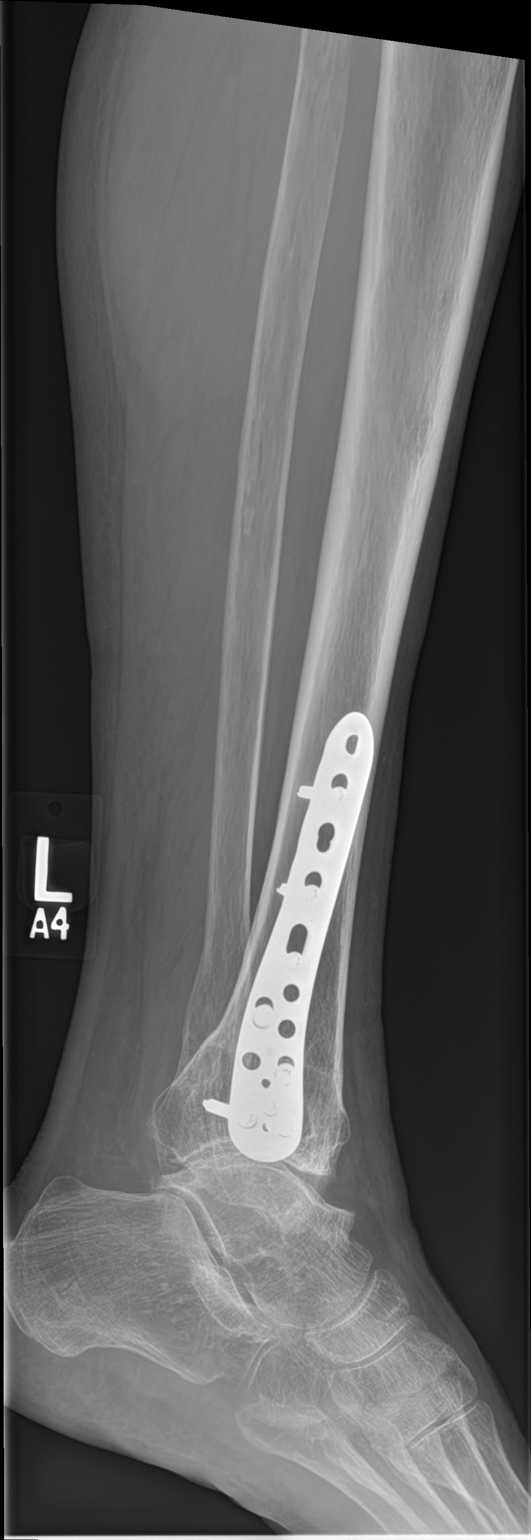

[tibia lat (2 of 2)]
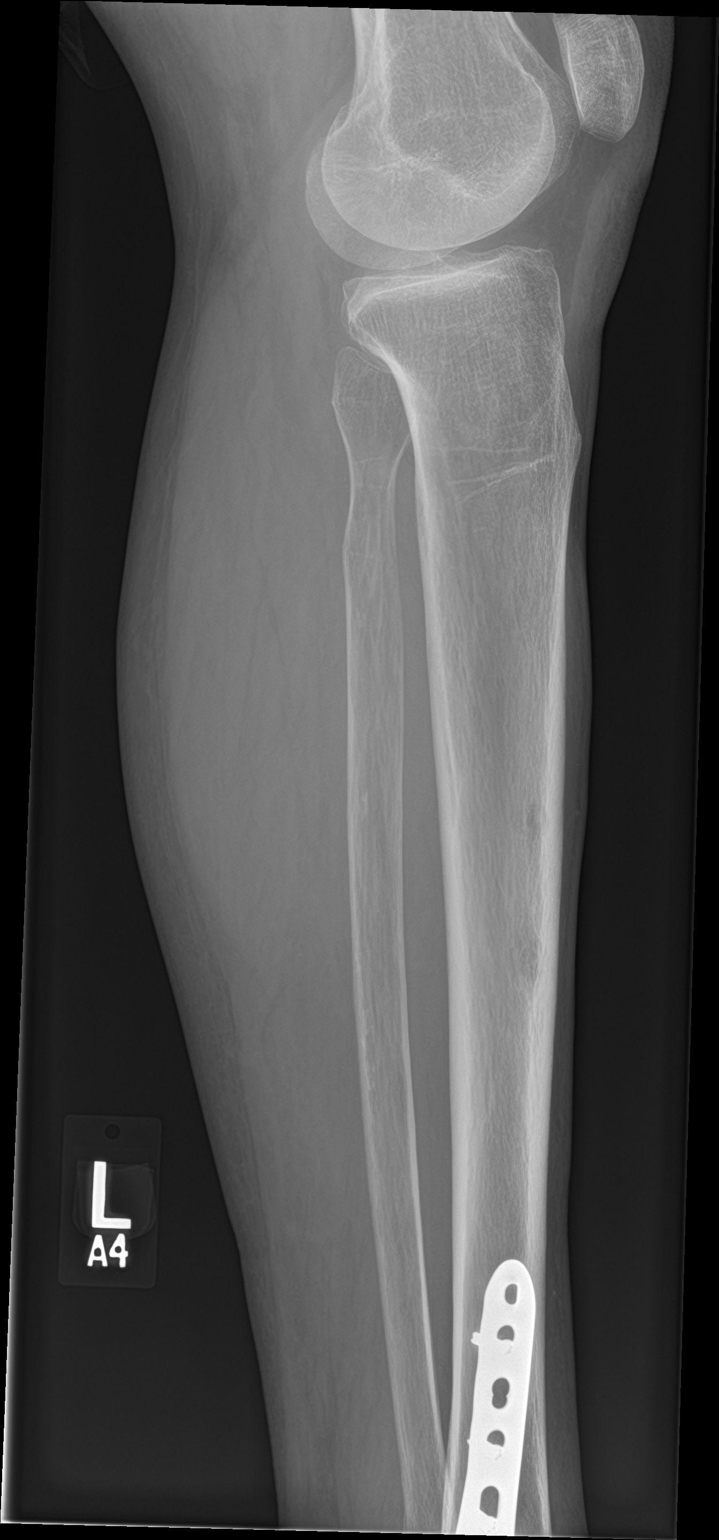

[4 of 4 positions shown; findings below may reference images not displayed]

FINDINGS: There are changes consistent with prior fixation of the distal left
tibia. No hardware failure is seen. No acute fracture or dislocation
is noted. Mild degenerative changes in the tarsal bones are noted.
Some mild degenerative changes in the tibiotalar articulation is
seen as well.
IMPRESSION: Postsurgical and chronic changes.  No acute abnormality noted.
